# Patient Record
Sex: Male | Born: 1950 | Race: White | Hispanic: No | Marital: Married | Smoking: Current every day smoker
Health system: Southern US, Community
[De-identification: ages and names within clinical notes are randomized; demographics above are authoritative.]

## PROBLEM LIST (undated history)

## (undated) DIAGNOSIS — N529 Male erectile dysfunction, unspecified: Secondary | ICD-10-CM

## (undated) DIAGNOSIS — T7840XA Allergy, unspecified, initial encounter: Secondary | ICD-10-CM

## (undated) HISTORY — PX: VASECTOMY: SHX75

## (undated) HISTORY — DX: Allergy, unspecified, initial encounter: T78.40XA

## (undated) HISTORY — DX: Male erectile dysfunction, unspecified: N52.9

---

## 2003-02-15 ENCOUNTER — Ambulatory Visit (HOSPITAL_COMMUNITY): Admission: RE | Admit: 2003-02-15 | Discharge: 2003-02-15 | Payer: Self-pay | Admitting: Family Medicine

## 2003-02-15 ENCOUNTER — Encounter: Payer: Self-pay | Admitting: Family Medicine

## 2012-08-12 ENCOUNTER — Ambulatory Visit (INDEPENDENT_AMBULATORY_CARE_PROVIDER_SITE_OTHER): Payer: Managed Care, Other (non HMO) | Admitting: Family Medicine

## 2012-08-12 ENCOUNTER — Encounter: Payer: Self-pay | Admitting: Family Medicine

## 2012-08-12 VITALS — BP 130/78 | Temp 98.3°F | Wt 187.2 lb

## 2012-08-12 DIAGNOSIS — M545 Low back pain: Secondary | ICD-10-CM

## 2012-08-12 MED ORDER — HYDROCODONE-ACETAMINOPHEN 5-325 MG PO TABS: 1.0000 | ORAL_TABLET | ORAL | Status: DC | PRN

## 2012-08-12 MED ORDER — ETODOLAC ER 400 MG PO TB24: 400.0000 mg | ORAL_TABLET | Freq: Every day | ORAL | Status: DC

## 2012-08-12 MED ORDER — CHLORZOXAZONE 500 MG PO TABS: 500.0000 mg | ORAL_TABLET | Freq: Four times a day (QID) | ORAL | Status: DC | PRN

## 2012-08-12 NOTE — Patient Instructions (Addendum)
Gradual return to regular duties

## 2012-08-12 NOTE — Progress Notes (Signed)
  Subjective:    Patient ID: Brandon Vega, male    DOB: 07-30-1950, 62 y.o.   MRN: [id number]  Back Pain The current episode started yesterday. The problem occurs constantly. The problem has been gradually worsening since onset. The pain is present in the lumbar spine. The quality of the pain is described as burning and aching. The pain is at a severity of 7/10. The pain is severe. The pain is worse during the day. The symptoms are aggravated by bending and twisting. Stiffness is present in the morning. He has tried nothing for the symptoms. The treatment provided no relief.      Review of Systems  Musculoskeletal: Positive for back pain.   Otherwise negative.    Objective:   Physical Exam  Alert. HEENT normal. Vitals reviewed. Lungs clear. Heart regular rate and rhythm. Negative straight leg raise. Distal strength sensation intact. Positive left lower lumbar tenderness to deep palpation.      Assessment & Plan:  Impression lumbar strain and spasm. Not sciatica-discussed. Plan Lodine 400 by mouth twice a day with food. Symptomatic care discussed. Chlorzoxazone as needed. Symptomatic care discussed WSL

## 2013-02-18 ENCOUNTER — Encounter: Payer: Self-pay | Admitting: Family Medicine

## 2013-02-18 ENCOUNTER — Ambulatory Visit (INDEPENDENT_AMBULATORY_CARE_PROVIDER_SITE_OTHER): Payer: Managed Care, Other (non HMO) | Admitting: Family Medicine

## 2013-02-18 VITALS — BP 118/80 | Temp 98.9°F | Ht 69.0 in | Wt 183.4 lb

## 2013-02-18 DIAGNOSIS — J329 Chronic sinusitis, unspecified: Secondary | ICD-10-CM

## 2013-02-18 MED ORDER — LEVOFLOXACIN 500 MG PO TABS: 500.0000 mg | ORAL_TABLET | Freq: Every day | ORAL | Status: AC

## 2013-02-18 NOTE — Progress Notes (Signed)
  Subjective:    Patient ID: Brandon Vega, male    DOB: [birth year], 62 y.o.   MRN: [id number]  Sinusitis This is a new problem. The current episode started yesterday. Associated symptoms include congestion, coughing, headaches, sinus pressure, sneezing and a sore throat. Past treatments include lying down and oral decongestants. The treatment provided mild relief.    First struck yest, works nights, real bad sore throat.  Went on to work,  Frontal pressure, no measured fever  otc med nyquil, left work early,  Plus vicks sore throat spray    Review of Systems  HENT: Positive for congestion, sinus pressure, sneezing and sore throat.   Respiratory: Positive for cough.   Neurological: Positive for headaches.       Objective:   Physical Exam  Alert no acute distress. Moderate nasal congestion. Frontal tenderness. Pharynx erythematous neck supple. Lungs clear. Heart rare in rhythm.      Assessment & Plan:  Impression 1 rhinosinusitis plan antibiotics as prescribed. Symptomatic care discussed. WSL

## 2013-06-08 ENCOUNTER — Encounter: Payer: Self-pay | Admitting: Family Medicine

## 2013-06-08 ENCOUNTER — Ambulatory Visit (INDEPENDENT_AMBULATORY_CARE_PROVIDER_SITE_OTHER): Payer: 59 | Admitting: Family Medicine

## 2013-06-08 VITALS — BP 132/80 | Ht 69.0 in | Wt 187.6 lb

## 2013-06-08 DIAGNOSIS — N529 Male erectile dysfunction, unspecified: Secondary | ICD-10-CM

## 2013-06-08 MED ORDER — SILDENAFIL CITRATE 50 MG PO TABS: 50.0000 mg | ORAL_TABLET | ORAL | Status: DC | PRN

## 2013-06-08 NOTE — Progress Notes (Signed)
   Subjective:    Patient ID: Brandon Vega, male    DOB: 08-Jan-1951, 63 y.o.   MRN: [id number]  HPI Patient is here today d/t a thumb injury on his right hand. He can move the thumb.  He injured it at work last Thursday.  He took Advil one time on Thursday. No other treatment.   Thumb sprung back and hurting,  Progressive difficulty with erections, Patient request refills on Viagra. Took some samples in the past. Definitely helps him. Use the 50 mg dose. No obvious side effects.  Review of Systems No chest pain no headache no back pain no abdominal pain ROS otherwise negative    Objective:   Physical Exam  Alert lungs clear. Heart regular in rhythm. Hands grip intact sensation pulses good slight tenderness at base of thumb. Good range of motion      Assessment & Plan:  Impression probable thumb strain #2 rectal dysfunction discussed plan Viagra 50 mg refilled. Symptomatic care discussed. Encouraged to do yearly preventive physicals. WSL

## 2013-06-11 DIAGNOSIS — N529 Male erectile dysfunction, unspecified: Secondary | ICD-10-CM | POA: Insufficient documentation

## 2013-06-18 ENCOUNTER — Telehealth: Payer: Self-pay | Admitting: Family Medicine

## 2013-06-18 MED ORDER — SILDENAFIL CITRATE 50 MG PO TABS: 50.0000 mg | ORAL_TABLET | ORAL | Status: DC | PRN

## 2013-06-18 NOTE — Telephone Encounter (Signed)
Error

## 2013-06-18 NOTE — Telephone Encounter (Signed)
Rx prior auth obtained for VIAGRA 50mg  however pt's plan only allows #15/30days, Rx was originally for #20/30days, if "OK", please send new order to [facility] with corrected quantity of 15, if not, I will have to do a quantity limit override through [employer]  Please notify pt when done. (already faxed approval to pharmacy)

## 2013-06-18 NOTE — Telephone Encounter (Signed)
Medication sent to pharmacy. Left message on voicemail notifying patient.  

## 2013-06-18 NOTE — Telephone Encounter (Signed)
Ok, let's do

## 2013-10-19 ENCOUNTER — Encounter: Payer: Self-pay | Admitting: Family Medicine

## 2013-10-19 ENCOUNTER — Ambulatory Visit (HOSPITAL_COMMUNITY)
Admission: RE | Admit: 2013-10-19 | Discharge: 2013-10-19 | Disposition: A | Discharge status: Home / Self Care | Payer: 59 | Source: Ambulatory Visit | Attending: Family Medicine | Admitting: Family Medicine

## 2013-10-19 ENCOUNTER — Ambulatory Visit (INDEPENDENT_AMBULATORY_CARE_PROVIDER_SITE_OTHER): Payer: 59 | Admitting: Family Medicine

## 2013-10-19 VITALS — BP 104/78 | Ht 69.0 in | Wt 183.4 lb

## 2013-10-19 DIAGNOSIS — R079 Chest pain, unspecified: Secondary | ICD-10-CM | POA: Insufficient documentation

## 2013-10-19 DIAGNOSIS — M546 Pain in thoracic spine: Secondary | ICD-10-CM

## 2013-10-19 DIAGNOSIS — M7918 Myalgia, other site: Secondary | ICD-10-CM

## 2013-10-19 DIAGNOSIS — IMO0001 Reserved for inherently not codable concepts without codable children: Secondary | ICD-10-CM

## 2013-10-19 DIAGNOSIS — M4120 Other idiopathic scoliosis, site unspecified: Secondary | ICD-10-CM | POA: Insufficient documentation

## 2013-10-19 DIAGNOSIS — M47817 Spondylosis without myelopathy or radiculopathy, lumbosacral region: Secondary | ICD-10-CM | POA: Insufficient documentation

## 2013-10-19 MED ORDER — TRAMADOL HCL 50 MG PO TABS: 50.0000 mg | ORAL_TABLET | Freq: Three times a day (TID) | ORAL | Status: DC | PRN

## 2013-10-19 MED ORDER — NAPROXEN 500 MG PO TABS: 500.0000 mg | ORAL_TABLET | Freq: Two times a day (BID) | ORAL | Status: DC

## 2013-10-19 NOTE — Progress Notes (Signed)
   Subjective:    Patient ID: Brandon Vega, male    DOB: 05/30/1950, 63 y.o.   MRN: [id number]  Back Pain This is a new problem. The current episode started 1 to 4 weeks ago. The problem occurs constantly. The problem has been gradually worsening since onset. The pain is present in the thoracic spine. The quality of the pain is described as aching. The pain does not radiate. The pain is at a severity of 5/10. The pain is moderate. The pain is the same all the time. Stiffness is present all day. Pertinent negatives include no abdominal pain, chest pain or fever. Treatments tried: icyhot. The treatment provided no relief.   Patient states his index finger on his right hand gets numb at times.  Taking some advil without help. Tried icey-hot without help Wakes him up at night No hx of this Hx low back pain years ago No surgery  Review of Systems  Constitutional: Negative for fever and fatigue.  HENT: Negative for congestion.   Respiratory: Negative for cough and shortness of breath.   Cardiovascular: Negative for chest pain.  Gastrointestinal: Negative for abdominal pain.  Musculoskeletal: Positive for back pain.       Objective:   Physical Exam  Constitutional: He appears well-developed.  Neck: Normal range of motion.  Cardiovascular: Normal rate, regular rhythm and normal heart sounds.   Pulmonary/Chest: Effort normal.  Abdominal: Soft.   Moderate back pain in the rhomboid area and no tenderness in the spine       Assessment & Plan:  We will do x-ray of thoracic spine in the chest patient is worried about possibility cancer states he had had cancer of lung his age. Patient states he does smoke a pipe he knows he ought to quit. I also counseled him about coming in for a wellness exam.  More than likely is a pinched nerve in the thoracic spine causing the rhomboid pain x-rays pending.  Followup Dr. Richardson Landry 2 weeks

## 2013-11-09 ENCOUNTER — Ambulatory Visit: Payer: 59 | Admitting: Family Medicine

## 2014-03-08 ENCOUNTER — Encounter: Payer: Self-pay | Admitting: Family Medicine

## 2014-03-08 ENCOUNTER — Ambulatory Visit (INDEPENDENT_AMBULATORY_CARE_PROVIDER_SITE_OTHER): Payer: 59 | Admitting: Family Medicine

## 2014-03-08 VITALS — BP 128/78 | Ht 69.0 in | Wt 184.0 lb

## 2014-03-08 DIAGNOSIS — G4700 Insomnia, unspecified: Secondary | ICD-10-CM

## 2014-03-08 NOTE — Progress Notes (Signed)
   Subjective:    Patient ID: Brandon Vega, male    DOB: Jan 18, 1951, 63 y.o.   MRN: [id number]  HPI Patient is here today to see if you would be able to write him a work excuse for 15 days.  He retires on Nov 27 and has 15 sick days left. He will lose those days if he doesn't take them.  He has been under a great deal of stress. His daughter-in-law left his son, and now he is helping with watching the children, which is causing stress in the family.   Trouble sleeping,patient states very tired all the time. Under a lot of stress. Concerned that he is not safe at work with his general fatigue.  Feels if he just gets time off to be able to catch up on all of his family responsibilities. And start getting more sleep.  Claims no significant depression.  Works [employer] office in [place] No headache no chest pain no back pain ROS otherwise    Objective:   Physical Exam Alert no apparent distress HEENT normal. Lungs clear heart regular rhythm.       Assessment & Plan:  Impression insomnia with fatigue plan symptomatic care discussed. Over-the-counter medicine discussed. Exercise encouraged. Work excuse written W SL

## 2014-09-10 ENCOUNTER — Ambulatory Visit (INDEPENDENT_AMBULATORY_CARE_PROVIDER_SITE_OTHER): Payer: 59 | Admitting: Family Medicine

## 2014-09-10 ENCOUNTER — Encounter: Payer: Self-pay | Admitting: Family Medicine

## 2014-09-10 VITALS — BP 128/82 | Ht 69.0 in | Wt 187.4 lb

## 2014-09-10 DIAGNOSIS — E785 Hyperlipidemia, unspecified: Secondary | ICD-10-CM | POA: Diagnosis not present

## 2014-09-10 DIAGNOSIS — Z79899 Other long term (current) drug therapy: Secondary | ICD-10-CM | POA: Diagnosis not present

## 2014-09-10 DIAGNOSIS — Z125 Encounter for screening for malignant neoplasm of prostate: Secondary | ICD-10-CM

## 2014-09-10 NOTE — Progress Notes (Signed)
   Subjective:    Patient ID: Brandon Vega, male    DOB: 1951/05/05, 64 y.o.   MRN: [id number]  HPI  The patient comes in today for a wellness visit.    A review of their health history was completed.   A review of medications was also completed.  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   Additional concerns: ears stopped up   fam hx of diabete  Hx of elevated chol dozen yrs ago  Hx of elev chol    Review of Systems  Constitutional: Negative for fever, activity change and appetite change.  HENT: Negative for congestion and rhinorrhea.   Eyes: Negative for discharge.  Respiratory: Negative for cough and wheezing.   Cardiovascular: Negative for chest pain.  Gastrointestinal: Negative for vomiting, abdominal pain and blood in stool.  Genitourinary: Negative for frequency and difficulty urinating.  Musculoskeletal: Negative for neck pain.  Skin: Negative for rash.  Allergic/Immunologic: Negative for environmental allergies and food allergies.  Neurological: Negative for weakness and headaches.  Psychiatric/Behavioral: Negative for agitation.  All other systems reviewed and are negative.      Objective:   Physical Exam  Constitutional: He appears well-developed and well-nourished.  HENT:  Head: Normocephalic and atraumatic.  Right Ear: External ear normal.  Left Ear: External ear normal.  Nose: Nose normal.  Mouth/Throat: Oropharynx is clear and moist.  Eyes: EOM are normal. Pupils are equal, round, and reactive to light.  Neck: Normal range of motion. Neck supple. No thyromegaly present.  Cardiovascular: Normal rate, regular rhythm and normal heart sounds.   No murmur heard. Pulmonary/Chest: Effort normal and  breath sounds normal. No respiratory distress. He has no wheezes.  Abdominal: Soft. Bowel sounds are normal. He exhibits no distension and no mass. There is no tenderness.  Genitourinary: Penis normal.  Musculoskeletal: Normal range of motion. He exhibits no edema.  Lymphadenopathy:    He has no cervical adenopathy.  Neurological: He is alert. He exhibits normal muscle tone.  Skin: Skin is warm and dry. No erythema.  Psychiatric: He has a normal mood and affect. His behavior is normal. Judgment normal.  Vitals reviewed.  prostate within normal limits. Left external canal wax present        Assessment & Plan:  Impression wellness exam plan anticipatory guidance given. Diet discussed. Appropriate blood work. Colonoscopy sheet given and encouraged. Pneumonia vaccine discussed WSL

## 2014-09-11 LAB — LIPID PANEL
CHOL/HDL RATIO: 3.8 ratio (ref 0.0–5.0)
CHOLESTEROL TOTAL: 195 mg/dL (ref 100–199)
HDL: 51 mg/dL (ref >39)
LDL Calculated: 121 mg/dL — ABNORMAL HIGH (ref 0–99)
TRIGLYCERIDES: 117 mg/dL (ref 0–149)
VLDL Cholesterol Cal: 23 mg/dL (ref 5–40)

## 2014-09-11 LAB — BASIC METABOLIC PANEL
BUN / CREAT RATIO: 16 (ref 10–22)
BUN: 15 mg/dL (ref 8–27)
CO2: 26 mmol/L (ref 18–29)
Calcium: 9.9 mg/dL (ref 8.6–10.2)
Chloride: 101 mmol/L (ref 97–108)
Creatinine, Ser: 0.96 mg/dL (ref 0.76–1.27)
GFR, EST AFRICAN AMERICAN: 95 mL/min/{1.73_m2} (ref >59)
GFR, EST NON AFRICAN AMERICAN: 82 mL/min/{1.73_m2} (ref >59)
Glucose: 77 mg/dL (ref 65–99)
Potassium: 4.7 mmol/L (ref 3.5–5.2)
Sodium: 141 mmol/L (ref 134–144)

## 2014-09-11 LAB — PSA: Prostate Specific Ag, Serum: 0.3 ng/mL (ref 0.0–4.0)

## 2014-09-11 LAB — HEPATIC FUNCTION PANEL
ALT: 22 IU/L (ref 0–44)
AST: 25 IU/L (ref 0–40)
Albumin: 4.5 g/dL (ref 3.6–4.8)
Alkaline Phosphatase: 60 IU/L (ref 39–117)
BILIRUBIN TOTAL: 1.5 mg/dL — AB (ref 0.0–1.2)
Bilirubin, Direct: 0.29 mg/dL (ref 0.00–0.40)
Total Protein: 6.8 g/dL (ref 6.0–8.5)

## 2014-09-12 ENCOUNTER — Encounter: Payer: Self-pay | Admitting: Family Medicine

## 2014-12-15 ENCOUNTER — Ambulatory Visit (INDEPENDENT_AMBULATORY_CARE_PROVIDER_SITE_OTHER): Payer: 59 | Admitting: Family Medicine

## 2014-12-15 ENCOUNTER — Encounter: Payer: Self-pay | Admitting: Family Medicine

## 2014-12-15 VITALS — BP 126/84 | Temp 99.0°F | Ht 69.0 in | Wt 183.0 lb

## 2014-12-15 DIAGNOSIS — J329 Chronic sinusitis, unspecified: Secondary | ICD-10-CM

## 2014-12-15 DIAGNOSIS — J310 Chronic rhinitis: Secondary | ICD-10-CM

## 2014-12-15 MED ORDER — AZITHROMYCIN 250 MG PO TABS: ORAL_TABLET | ORAL | Status: DC

## 2014-12-15 NOTE — Progress Notes (Signed)
   Subjective:    Patient ID: Brandon Vega, male    DOB: 1950-08-02, 64 y.o.   MRN: [id number]  Cough This is a new problem. The current episode started in the past 7 days (started while in [place]). Associated symptoms include nasal congestion and a sore throat. Treatments tried: OTC cold medicine, advil cold and sinus and decongestant. The treatment provided mild relief.    cold sneezy and runny nose  Some loose stools    No major sickness on the team   ++ Review of Systems  HENT: Positive for sore throat.   Respiratory: Positive for cough.        Objective:   Physical Exam Alert vitals stable. HEENT moderate nasal congestion TMs retracted pharynx slight erythema neck supple. Lungs clear. Heart regular in rhythm.       Assessment & Plan:  No vomiting no diarrhea impression 1 post viral Rhinosyn he sinusitis element of otitis plan antibiotics prescribed. Symptomatic care discussed. WSL

## 2016-03-12 ENCOUNTER — Telehealth: Payer: Self-pay | Admitting: Family Medicine

## 2016-03-12 ENCOUNTER — Other Ambulatory Visit: Payer: Self-pay | Admitting: *Deleted

## 2016-03-12 MED ORDER — CIPROFLOXACIN HCL 500 MG PO TABS: 500.0000 mg | ORAL_TABLET | Freq: Two times a day (BID) | ORAL | 0 refills | Status: DC

## 2016-03-12 NOTE — Telephone Encounter (Signed)
Med sent to pharm. Pt notified on voicemail.  

## 2016-03-12 NOTE — Telephone Encounter (Signed)
Not a bad iedea to take cipro 500 one bid for five d if gets travelers diarrhea

## 2016-03-12 NOTE — Progress Notes (Unsigned)
cipro

## 2016-03-12 NOTE — Telephone Encounter (Signed)
Pt is leaving for [place] this Sat. Pt was told by the team leader that he would need to take antibiotics with him just incase due to the conditions there. Please advise.     [place]

## 2016-09-17 ENCOUNTER — Encounter: Payer: Self-pay | Admitting: Family Medicine

## 2016-09-17 ENCOUNTER — Ambulatory Visit (INDEPENDENT_AMBULATORY_CARE_PROVIDER_SITE_OTHER): Payer: 59 | Admitting: Family Medicine

## 2016-09-17 VITALS — BP 118/84 | Temp 99.0°F | Ht 69.0 in | Wt 190.0 lb

## 2016-09-17 DIAGNOSIS — H9319 Tinnitus, unspecified ear: Secondary | ICD-10-CM

## 2016-09-17 NOTE — Progress Notes (Signed)
   Subjective:    Patient ID: Brandon Vega, male    DOB: [birth year], 66 y.o.   MRN: [id number]  HPIringing and trouble hearing in both ears for over one year. Getting worse.  Failed hearing screen.   Gotten worse touble hearing the t v, having to turn up the vlume, difficulty hearing speeh  Developed gum infxn after a cleaning  Back on abx, still taing   Trouble hwRING AND TROUBLE WITH RINGING IN 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 Taking antibiotics from dentist.    Review of Systems No headache, no major weight loss or weight gain, no chest pain no back pain abdominal pain no change in bowel habits complete ROS otherwise negative     Objective:   Physical Exam Alert vitals stable, NAD. Blood pressure good on repeat. HEENT normal. Lungs clear. Heart regular rate and rhythm. 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 ENT referral recommended discussed we'll press on

## 2016-09-25 ENCOUNTER — Encounter: Payer: Self-pay | Admitting: Family Medicine

## 2016-10-29 ENCOUNTER — Ambulatory Visit (INDEPENDENT_AMBULATORY_CARE_PROVIDER_SITE_OTHER): Payer: 59 | Admitting: Otolaryngology

## 2016-10-29 DIAGNOSIS — H6122 Impacted cerumen, left ear: Secondary | ICD-10-CM

## 2016-10-29 DIAGNOSIS — H903 Sensorineural hearing loss, bilateral: Secondary | ICD-10-CM | POA: Diagnosis not present

## 2016-10-29 DIAGNOSIS — H9313 Tinnitus, bilateral: Secondary | ICD-10-CM | POA: Diagnosis not present

## 2017-04-29 ENCOUNTER — Ambulatory Visit (INDEPENDENT_AMBULATORY_CARE_PROVIDER_SITE_OTHER): Payer: 59 | Admitting: Otolaryngology

## 2017-04-29 DIAGNOSIS — H9313 Tinnitus, bilateral: Secondary | ICD-10-CM

## 2017-04-29 DIAGNOSIS — H903 Sensorineural hearing loss, bilateral: Secondary | ICD-10-CM | POA: Diagnosis not present

## 2017-06-04 ENCOUNTER — Ambulatory Visit: Payer: 59 | Admitting: Family Medicine

## 2017-06-04 ENCOUNTER — Encounter: Payer: Self-pay | Admitting: Family Medicine

## 2017-06-04 VITALS — BP 122/82 | Temp 98.2°F | Ht 69.0 in | Wt 192.2 lb

## 2017-06-04 DIAGNOSIS — B349 Viral infection, unspecified: Secondary | ICD-10-CM | POA: Diagnosis not present

## 2017-06-04 DIAGNOSIS — J019 Acute sinusitis, unspecified: Secondary | ICD-10-CM

## 2017-06-04 MED ORDER — AZITHROMYCIN 250 MG PO TABS: ORAL_TABLET | ORAL | 0 refills | Status: DC

## 2017-06-04 NOTE — Progress Notes (Addendum)
   Subjective:    Patient ID: Brandon Vega, male    DOB: [birth year], 67 y.o.   MRN: [id number]  Cough  This is a new problem. The current episode started in the past 7 days. Associated symptoms include chills, headaches, nasal congestion, rhinorrhea and a sore throat. Pertinent negatives include no chest pain, ear pain, fever or wheezing. He has tried OTC cough suppressant for the symptoms.  Patient was started off with a little bit of body aches headache some chills denies high fever chills sweats otherwise denies shortness of breath wheezing vomiting does relate sinus congestion    Review of Systems  Constitutional: Positive for chills. Negative for activity change and fever.  HENT: Positive for congestion, rhinorrhea and sore throat. Negative for ear pain.   Eyes: Negative for discharge.  Respiratory: Positive for cough. Negative for wheezing.   Cardiovascular: Negative for chest pain.  Gastrointestinal: Negative for nausea and vomiting.  Musculoskeletal: Negative for arthralgias.  Neurological: Positive for headaches.       Objective:   Physical Exam  Constitutional: He appears well-developed.  HENT:  Head: Normocephalic and atraumatic.  Mouth/Throat: Oropharynx is clear and moist. No oropharyngeal exudate.  Eyes: Right eye exhibits no discharge. Left eye exhibits no discharge.  Neck: Normal range of motion.  Cardiovascular: Normal rate, regular rhythm and normal heart sounds.  No murmur heard. Pulmonary/Chest: Effort normal and breath sounds normal. No respiratory distress. He has no wheezes. He has no rales.  Lymphadenopathy:    He has no cervical adenopathy.  Neurological: He exhibits normal muscle tone.  Skin: Skin is warm and dry.  Nursing note and vitals reviewed.     I did tell the patient if he gets worse he needs a follow-up    Assessment & Plan:  Viral syndrome possibly flulike illness no signs of any serious complication there is no need for any type of  lab work x-rays etc.  Patient was instructed to lay low over the next few days patient instructed to avoid any type of regular exercise for now  Patient does feel he is developing a mild sinus infection he requests an antibiotic which is reasonable antibiotic was sent and warning signs were discussed

## 2017-08-06 ENCOUNTER — Encounter: Payer: Self-pay | Admitting: Family Medicine

## 2017-08-06 ENCOUNTER — Ambulatory Visit (INDEPENDENT_AMBULATORY_CARE_PROVIDER_SITE_OTHER): Payer: 59 | Admitting: Family Medicine

## 2017-08-06 VITALS — BP 148/96 | Ht 68.5 in | Wt 190.0 lb

## 2017-08-06 DIAGNOSIS — Z125 Encounter for screening for malignant neoplasm of prostate: Secondary | ICD-10-CM | POA: Diagnosis not present

## 2017-08-06 DIAGNOSIS — Z0000 Encounter for general adult medical examination without abnormal findings: Secondary | ICD-10-CM

## 2017-08-06 DIAGNOSIS — Z1329 Encounter for screening for other suspected endocrine disorder: Secondary | ICD-10-CM

## 2017-08-06 DIAGNOSIS — R030 Elevated blood-pressure reading, without diagnosis of hypertension: Secondary | ICD-10-CM | POA: Diagnosis not present

## 2017-08-06 NOTE — Patient Instructions (Signed)

## 2017-08-06 NOTE — Progress Notes (Signed)
   Subjective:    Patient ID: Brandon Vega, male    DOB: Jul 26, 1950, 67 y.o.   MRN: [id number]  HPI AWV- Annual Wellness Visit  The patient was seen for their annual wellness visit. The patient's past medical history, surgical history, and family history were reviewed. Pertinent vaccines were reviewed ( tetanus, pneumonia, shingles, flu) The patient's medication list was reviewed and updated.  The height and weight were entered.  BMI recorded in electronic record elsewhere  Cognitive screening was completed. Outcome of Mini - Cog: pass   Falls /depression screening electronically recorded within record elsewhere  Current tobacco usage: every day smoker (All patients who use tobacco were given written and verbal information on quitting)  Recent listing of emergency department/hospitalizations over the past year were reviewed.  current specialist the patient sees on a regular basis: none   Medicare annual wellness visit patient questionnaire was reviewed.  A written screening schedule for the patient for the next 5-10 years was given. Appropriate discussion of followup regarding next visit was discussed.   Pt had right flank pain, co wondered if due to new genix supplement which pt take as energy booster    Several yrs since blood work   Good exercise regimen reg  Never had colonoscopy  Smokes pipe reg ,    Married 81 yrs    Review of Systems  Constitutional: Negative for activity change, appetite change and fever.  HENT: Negative for congestion and rhinorrhea.   Eyes: Negative for discharge.  Respiratory: Negative for cough and wheezing.   Cardiovascular: Negative for chest pain.  Gastrointestinal: Negative for abdominal pain, blood in stool and vomiting.  Genitourinary: Negative for difficulty urinating and frequency.  Musculoskeletal: Negative for neck pain.  Skin: Negative for rash.  Allergic/Immunologic: Negative for environmental allergies and food  allergies.  Neurological: Negative for weakness and headaches.  Psychiatric/Behavioral: Negative for agitation.  All other systems reviewed and are negative.      Objective:   Physical Exam  Constitutional: He appears well-developed and well-nourished.  HENT:  Head: Normocephalic and atraumatic.  Right Ear: External ear normal.  Left Ear: External ear normal.  Nose: Nose normal.  Mouth/Throat: Oropharynx is clear and moist.  Eyes: Right eye exhibits no discharge. Left eye exhibits no discharge. No scleral icterus.  Neck: Normal range of motion. Neck supple. No thyromegaly present.  Cardiovascular: Normal rate, regular rhythm and normal heart sounds.  No murmur heard. Pulmonary/Chest: Effort normal and breath sounds normal. No respiratory distress. He has no wheezes.  Abdominal: Soft. Bowel sounds are normal. He exhibits no distension and no mass. There is no tenderness.  Genitourinary: Penis normal.  Genitourinary Comments: Prostate exam within normal limits  Musculoskeletal: Normal range of motion. He exhibits no edema.  Lymphadenopathy:    He has no cervical adenopathy.  Neurological: He is alert. He exhibits normal muscle tone. Coordination normal.  Skin: Skin is warm and dry. No erythema.  Psychiatric: He has a normal mood and affect. His behavior is normal. Judgment normal.  Vitals reviewed.         Assessment & Plan:  Impression wellness exam patient finally agrees to colonoscopy we will help set up appropriate blood work.  Diet exercise discussed.  Vaccines clarified  2.  Elevated blood pressure.  Concerning to the patient.  Seen this multiple times at home will do some blood work.  Educational information given.  Recheck in several weeks if numbers still elevated will initiate meds

## 2017-08-07 ENCOUNTER — Encounter (INDEPENDENT_AMBULATORY_CARE_PROVIDER_SITE_OTHER): Payer: Self-pay | Admitting: *Deleted

## 2017-08-07 ENCOUNTER — Encounter: Payer: Self-pay | Admitting: Family Medicine

## 2017-08-08 LAB — CBC WITH DIFFERENTIAL/PLATELET
BASOS: 0 %
Basophils Absolute: 0.1 10*3/uL (ref 0.0–0.2)
EOS (ABSOLUTE): 0.3 10*3/uL (ref 0.0–0.4)
EOS: 2 %
HEMATOCRIT: 45.3 % (ref 37.5–51.0)
HEMOGLOBIN: 14.8 g/dL (ref 13.0–17.7)
IMMATURE GRANS (ABS): 0 10*3/uL (ref 0.0–0.1)
Immature Granulocytes: 0 %
Lymphocytes Absolute: 5.6 10*3/uL — ABNORMAL HIGH (ref 0.7–3.1)
Lymphs: 49 %
MCH: 31.4 pg (ref 26.6–33.0)
MCHC: 32.7 g/dL (ref 31.5–35.7)
MCV: 96 fL (ref 79–97)
Monocytes Absolute: 0.7 10*3/uL (ref 0.1–0.9)
Monocytes: 6 %
NEUTROS ABS: 5.1 10*3/uL (ref 1.4–7.0)
Neutrophils: 43 %
Platelets: 243 10*3/uL (ref 150–379)
RBC: 4.71 x10E6/uL (ref 4.14–5.80)
RDW: 13.2 % (ref 12.3–15.4)
WBC: 11.7 10*3/uL — ABNORMAL HIGH (ref 3.4–10.8)

## 2017-08-08 LAB — LIPID PANEL
CHOL/HDL RATIO: 4.1 ratio (ref 0.0–5.0)
CHOLESTEROL TOTAL: 189 mg/dL (ref 100–199)
HDL: 46 mg/dL (ref >39)
LDL Calculated: 126 mg/dL — ABNORMAL HIGH (ref 0–99)
TRIGLYCERIDES: 87 mg/dL (ref 0–149)
VLDL Cholesterol Cal: 17 mg/dL (ref 5–40)

## 2017-08-08 LAB — TSH: TSH: 2.61 u[IU]/mL (ref 0.450–4.500)

## 2017-08-08 LAB — BASIC METABOLIC PANEL
BUN / CREAT RATIO: 12 (ref 10–24)
BUN: 12 mg/dL (ref 8–27)
CO2: 22 mmol/L (ref 20–29)
Calcium: 9.4 mg/dL (ref 8.6–10.2)
Chloride: 104 mmol/L (ref 96–106)
Creatinine, Ser: 1 mg/dL (ref 0.76–1.27)
GFR calc Af Amer: 88 mL/min/{1.73_m2} (ref >59)
GFR calc non Af Amer: 76 mL/min/{1.73_m2} (ref >59)
GLUCOSE: 91 mg/dL (ref 65–99)
POTASSIUM: 4.9 mmol/L (ref 3.5–5.2)
SODIUM: 142 mmol/L (ref 134–144)

## 2017-08-08 LAB — PSA: Prostate Specific Ag, Serum: 0.3 ng/mL (ref 0.0–4.0)

## 2017-08-08 LAB — HEPATIC FUNCTION PANEL
ALBUMIN: 4.2 g/dL (ref 3.6–4.8)
ALT: 23 IU/L (ref 0–44)
AST: 28 IU/L (ref 0–40)
Alkaline Phosphatase: 59 IU/L (ref 39–117)
Bilirubin Total: 1.5 mg/dL — ABNORMAL HIGH (ref 0.0–1.2)
Bilirubin, Direct: 0.33 mg/dL (ref 0.00–0.40)
TOTAL PROTEIN: 6.6 g/dL (ref 6.0–8.5)

## 2017-08-26 ENCOUNTER — Ambulatory Visit: Payer: 59 | Admitting: Family Medicine

## 2017-08-26 ENCOUNTER — Encounter: Payer: Self-pay | Admitting: Family Medicine

## 2017-08-26 VITALS — BP 150/90 | Ht 68.5 in | Wt 191.0 lb

## 2017-08-26 DIAGNOSIS — M25552 Pain in left hip: Secondary | ICD-10-CM

## 2017-08-26 DIAGNOSIS — I10 Essential (primary) hypertension: Secondary | ICD-10-CM | POA: Insufficient documentation

## 2017-08-26 DIAGNOSIS — Z79899 Other long term (current) drug therapy: Secondary | ICD-10-CM

## 2017-08-26 MED ORDER — ENALAPRIL MALEATE 5 MG PO TABS: ORAL_TABLET | ORAL | 5 refills | Status: DC

## 2017-08-26 MED ORDER — DICLOFENAC SODIUM 75 MG PO TBEC: DELAYED_RELEASE_TABLET | ORAL | 0 refills | Status: DC

## 2017-08-26 NOTE — Progress Notes (Signed)
   Subjective:    Patient ID: Jose Persia, male    DOB: [birth year], 67 y.o.   MRN: [id number] Patient arrives with multiple concerns HPI Patient is here today to follow up high bp.He states he has been checking bp at [facility] and they are 128/82,138/86,105/72,113/79,135/81.  No fam hx of htn     has left pain under left buttock when sitting and when he gets up. Left hip pain, for about two weeks, hurts when pt ogoes to get up. Pain in lat post hip. Notes a bit of trouble wlking, limps at first  Has con'td to exercise, running on the treafmill and doing the elliptical   Pt has ellip at the [employer] a couple of avil to see if pain would go away          Review of Systems No headache, no major weight loss or weight gain, no chest pain no back pain abdominal pain no change in bowel habits complete ROS otherwise negative     Objective:   Physical Exam  Alert and oriented, vitals reviewed and stable, NAD ENT-TM's and ext canals WNL bilat via otoscopic exam Soft palate, tonsils and post pharynx WNL via oropharyngeal exam Neck-symmetric, no masses; thyroid nonpalpable and nontender Pulmonary-no tachypnea or accessory muscle use; Clear without wheezes via auscultation Card--no abnrml murmurs, rhythm reg and rate WNL Carotid pulses symmetric, without bruits Negative straight leg raise, negative sciatic notch tenderness, slight lateral trochanteric prominence tenderness.  Some pain with internal rotation    impression 1 hypertension.  Discussed at length long-term potential negatives discussed with medication discussed side effects benefits discussed      Assessment & Plan:    #2  Hip pain.  2 weeks duration.  To call chronic.  Trial of anti-inflammatory.  Back off of it on excess exertion.  Discussed  Greater than 50% of this 25 minute face to face visit was spent in counseling and discussion and coordination of care regarding the above diagnosis/diagnosies

## 2017-08-26 NOTE — Patient Instructions (Signed)
Omron 5 series, at [employer], forty bucks ,  +

## 2017-09-04 ENCOUNTER — Telehealth: Payer: Self-pay | Admitting: Family Medicine

## 2017-09-04 NOTE — Telephone Encounter (Signed)
Patient was seen on 4/15 with left hip pain stating it worst and wants something called into [facility]

## 2017-09-04 NOTE — Telephone Encounter (Signed)
ok 

## 2017-09-04 NOTE — Telephone Encounter (Signed)
Patient states he did not want anything called in.He says he may need to be seen again as it is no better and it may need xray. Pt scheduled for an appointment for 09/05/2017 here.

## 2017-09-05 ENCOUNTER — Ambulatory Visit: Payer: 59 | Admitting: Family Medicine

## 2017-09-05 ENCOUNTER — Encounter: Payer: Self-pay | Admitting: Family Medicine

## 2017-09-05 ENCOUNTER — Telehealth: Payer: Self-pay | Admitting: Family Medicine

## 2017-09-05 VITALS — BP 132/78 | Ht 68.5 in | Wt 193.0 lb

## 2017-09-05 DIAGNOSIS — M25552 Pain in left hip: Secondary | ICD-10-CM

## 2017-09-05 DIAGNOSIS — M25559 Pain in unspecified hip: Secondary | ICD-10-CM

## 2017-09-05 MED ORDER — DICLOFENAC SODIUM 75 MG PO TBEC: DELAYED_RELEASE_TABLET | ORAL | 0 refills | Status: DC

## 2017-09-05 NOTE — Telephone Encounter (Signed)
Patient is aware he should hear from the Dr Verneita Griffes soon.Referral placed in the epic.

## 2017-09-05 NOTE — Telephone Encounter (Signed)
Patient called with the name of the doctor he would like to be referred to for his hip.  He said he would like to be referred to Dr. Reynaldo Minium at [facility].

## 2017-09-05 NOTE — Telephone Encounter (Signed)
FYI

## 2017-09-05 NOTE — Telephone Encounter (Signed)
Lets do it

## 2017-09-05 NOTE — Progress Notes (Signed)
   Subjective:    Patient ID: Brandon Vega, male    DOB: 02/10/1951, 67 y.o.   MRN: [id number]  Hip Pain   The incident occurred more than 1 week ago. The pain is present in the left hip and left thigh. He has tried NSAIDs (Advil) for the symptoms. The treatment provided mild relief.    Pt states he was running Monday and he felt something pull. When he goes to stand up he feels a "pulled" sensation in his hip area.   Pt tried to run slowly  Pt felt something "go" in the back part of the hip  Had to limp back to the gym  Trouble walking ,  No prob lwith hip down thu the yrs  Know low back    Last week took it easy tried not to exert himsef  Review of Systems No headache, no major weight loss or weight gain, no chest pain no back pain abdominal pain no change in bowel habits complete ROS otherwise negative     Objective:   Physical Exam  Alert vitals stable, NAD. Blood pressure good on repeat. HEENT normal. Lungs clear. Heart regular rate and rhythm. Negative straight leg raise left side positive posterior hip tenderness.  Negative sciatic notch tenderness.  Negative lateral tenderness positive pain with internal touch      Assessment & Plan:  Impression posterior hip bursitis.  Patient likes to run.  Periactin.  We will do x-ray.  Orthopedic referral because of high activity level discussed

## 2017-09-06 ENCOUNTER — Other Ambulatory Visit (INDEPENDENT_AMBULATORY_CARE_PROVIDER_SITE_OTHER): Payer: Self-pay | Admitting: *Deleted

## 2017-09-06 ENCOUNTER — Ambulatory Visit (HOSPITAL_COMMUNITY)
Admission: RE | Admit: 2017-09-06 | Discharge: 2017-09-06 | Disposition: A | Discharge status: Home / Self Care | Payer: 59 | Source: Ambulatory Visit | Attending: Family Medicine | Admitting: Family Medicine

## 2017-09-06 DIAGNOSIS — M25552 Pain in left hip: Secondary | ICD-10-CM | POA: Diagnosis present

## 2017-09-06 DIAGNOSIS — M1612 Unilateral primary osteoarthritis, left hip: Secondary | ICD-10-CM | POA: Insufficient documentation

## 2017-09-06 DIAGNOSIS — Z1211 Encounter for screening for malignant neoplasm of colon: Secondary | ICD-10-CM

## 2017-09-17 ENCOUNTER — Telehealth: Payer: Self-pay | Admitting: Family Medicine

## 2017-09-17 ENCOUNTER — Encounter: Payer: Self-pay | Admitting: Family Medicine

## 2017-09-17 MED ORDER — DICLOFENAC SODIUM 75 MG PO TBEC: DELAYED_RELEASE_TABLET | ORAL | 1 refills | Status: DC

## 2017-09-17 NOTE — Telephone Encounter (Signed)
Prescription faxed to pharmacy. Patient notified. 

## 2017-09-17 NOTE — Telephone Encounter (Signed)
Patient said he only has a few days left on his Diclofenac Rx.  He is requesting a refill on this.  [employer] [address]

## 2017-09-17 NOTE — Telephone Encounter (Signed)
Seen 4/25 for hip pain

## 2017-09-17 NOTE — Telephone Encounter (Signed)
Ok plus one ref 

## 2017-11-04 ENCOUNTER — Telehealth: Payer: Self-pay | Admitting: Family Medicine

## 2017-11-04 MED ORDER — DICLOFENAC SODIUM 75 MG PO TBEC: DELAYED_RELEASE_TABLET | ORAL | 1 refills | Status: DC

## 2017-11-04 NOTE — Telephone Encounter (Signed)
Some take for yrs but I would prefer pt not to, give enough refills to last til next visit, if pt can self wewan in mean time, switching back to tylenol, would be best, if cant stay on, and f u aS SCHED

## 2017-11-04 NOTE — Telephone Encounter (Signed)
Diclofenac initated for the hip bursitis and not meant for the long term, having said that some folks take long term for chronic arthritis and can experience stomach ulcers and uncommonly kidney damage if they take for yrs, hopefull his ortho will fix th e bursitis so he can stop soon, it is not causing all his other achey joints, may want to mention those to his ortho

## 2017-11-04 NOTE — Telephone Encounter (Signed)
Patient has been taking diclofenac due to his hip.  He wants to know if there are any long-term side effects to taking this?  He is experiencing a lot of pain in shoulders, hip, pain and legs and wants to know if this could be coming from the Diclofenac.

## 2017-11-04 NOTE — Telephone Encounter (Signed)
Patient advised per Dr Richardson Landry- Diclofenac initated for the hip bursitis and not meant for the long term, having said that some folks take long term for chronic arthritis and can experience stomach ulcers and uncommonly kidney damage if they take for yrs, hopefull his ortho will fix th e bursitis so he can stop soon, it is not causing all his other achey joints, may want to mention those to his ortho. Patient verbalized understanding and stated he has already seen the ortho and he said it was arthritis and gave him exercise sheet and to follow up in 6 months. Patient said it feels better with the med but he was just not sure how long it was ok to take it this would be his 4th refill. Patient states he would like a refill if you feel like it is ok to keep taking it.

## 2017-11-04 NOTE — Telephone Encounter (Signed)
Patient advised per Dr Richardson Landry- Some take for yrs but he would prefer pt not to, give enough refills to last til next visit, if pt can self wean in mean time, switching back to tylenol, would be best, if cant stay on, and follow up as scheduled. Patient verbalized understanding. Prescription sent electronically to pharmacy.

## 2017-11-12 ENCOUNTER — Encounter (INDEPENDENT_AMBULATORY_CARE_PROVIDER_SITE_OTHER): Payer: Self-pay | Admitting: *Deleted

## 2017-11-12 ENCOUNTER — Telehealth (INDEPENDENT_AMBULATORY_CARE_PROVIDER_SITE_OTHER): Payer: Self-pay | Admitting: *Deleted

## 2017-11-12 MED ORDER — PEG 3350-KCL-NA BICARB-NACL 420 G PO SOLR: 4000.0000 mL | Freq: Once | ORAL | 0 refills | Status: AC

## 2017-11-12 NOTE — Telephone Encounter (Signed)
Patient needs trilyte 

## 2017-11-22 ENCOUNTER — Encounter: Payer: Self-pay | Admitting: Family Medicine

## 2017-11-22 ENCOUNTER — Ambulatory Visit: Payer: 59 | Admitting: Family Medicine

## 2017-11-22 VITALS — BP 142/88 | Ht 68.5 in | Wt 191.2 lb

## 2017-11-22 DIAGNOSIS — M545 Low back pain, unspecified: Secondary | ICD-10-CM

## 2017-11-22 DIAGNOSIS — M25552 Pain in left hip: Secondary | ICD-10-CM

## 2017-11-22 DIAGNOSIS — I10 Essential (primary) hypertension: Secondary | ICD-10-CM

## 2017-11-22 MED ORDER — DICLOFENAC SODIUM 75 MG PO TBEC: DELAYED_RELEASE_TABLET | ORAL | 2 refills | Status: DC

## 2017-11-22 MED ORDER — AMOXICILLIN 500 MG PO CAPS: ORAL_CAPSULE | ORAL | 0 refills | Status: DC

## 2017-11-22 MED ORDER — ENALAPRIL MALEATE 5 MG PO TABS: ORAL_TABLET | ORAL | 5 refills | Status: DC

## 2017-11-22 NOTE — Progress Notes (Signed)
   Subjective:    Patient ID: Brandon Vega, male    DOB: February 08, 1951, 67 y.o.   MRN: [id number]  Hypertension  This is a chronic problem. There are no compliance problems.    Blood pressure medicine and blood pressure levels reviewed today with patient. Compliant with blood pressure medicine. States does not miss a dose. No obvious side effects. Blood pressure generally good when checked elsewhere. Watching salt intake.   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 He went to orthopedic in [place]. 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 The diclofenac really helps       Review of Systems No headache, no major weight loss or weight gain, no chest pain no back pain abdominal pain no change in bowel habits complete ROS otherwise negative     Objective:   Physical Exam  Alert and oriented, vitals reviewed and stable, NAD ENT-TM's and ext canals WNL bilat via otoscopic exam Soft palate, tonsils and post pharynx WNL via oropharyngeal exam Neck-symmetric, no masses; thyroid nonpalpable and nontender Pulmonary-no tachypnea or accessory muscle use; Clear without wheezes via auscultation Card--no abnrml murmurs, rhythm reg and rate WNL Carotid pulses symmetric, without bruits  Negative straight leg raise positive para spinal lumbar tenderness left side negative sciatic notch     Assessment & Plan:  1 impression hypertension.  Generally good  control.  Numbers up a bit today.  Unable to take medication last night  2.  Lumbar strain.  Discussed.  Highly doubt sciatica.  Rationale discussed.  Local measures discussed.  Regular stretching strengthening exercises improved  3.  Long-term diclofenac use.  Discussed at length somewhat concerning to patient discussed potential for long-term renal damage.  Will use medicine more on a as needed basis.  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

## 2017-11-25 ENCOUNTER — Other Ambulatory Visit: Payer: Self-pay | Admitting: Family Medicine

## 2017-11-25 ENCOUNTER — Telehealth: Payer: Self-pay | Admitting: Family Medicine

## 2017-11-25 ENCOUNTER — Ambulatory Visit: Payer: 59 | Admitting: Family Medicine

## 2017-11-25 MED ORDER — TIZANIDINE HCL 4 MG PO CAPS: ORAL_CAPSULE | ORAL | 0 refills | Status: DC

## 2017-11-25 MED ORDER — PREDNISONE 20 MG PO TABS: ORAL_TABLET | ORAL | 0 refills | Status: DC

## 2017-11-25 NOTE — Telephone Encounter (Signed)
Was seen Friday.  Hip and back pain has gotten worse.  Said meds is not touching the pain.  Can we give him something stronger? [facility] [address]

## 2017-11-25 NOTE — Telephone Encounter (Signed)
Spoke with patient. Informed patient to stop Diclofenac. Sent meds into pharmacy. Pt verbalized understanding.

## 2017-11-25 NOTE — Telephone Encounter (Signed)
Stop diclofenac, adult pred taper, add zanaflex  4 mg one tid ten days worth, adult pred tpaer is the strongest anti inflammatory approach we have, if not substantially better by next wk rec o v

## 2017-11-25 NOTE — Telephone Encounter (Signed)
Patient given Diclofenac on Friday

## 2017-12-03 ENCOUNTER — Telehealth (INDEPENDENT_AMBULATORY_CARE_PROVIDER_SITE_OTHER): Payer: Self-pay | Admitting: *Deleted

## 2017-12-03 NOTE — Telephone Encounter (Signed)
Referring MD/PCP: steve luking   Procedure: tcs  Reason/Indication:  screening  Has patient had this procedure before?  no  If so, when, by whom and where?    Is there a family history of colon cancer?  no  Who?  What age when diagnosed?    Is patient diabetic?   no      Does patient have prosthetic heart valve or mechanical valve?  no  Do you have a pacemaker?  no  Has patient ever had endocarditis? no  Has patient had joint replacement within last 12 months?  no  Is patient constipated or do they take laxatives? no  Does patient have a history of alcohol/drug use?  no  Is patient on blood thinner such as Coumadin, Plavix and/or Aspirin? no  Medications: vitamins, nugenix  Allergies: nkda  Medication Adjustment per Dr Lindi Adie, NP:   Procedure date & time: 12/26/17 at 930

## 2017-12-04 NOTE — Telephone Encounter (Signed)
agree

## 2017-12-05 ENCOUNTER — Ambulatory Visit (INDEPENDENT_AMBULATORY_CARE_PROVIDER_SITE_OTHER): Payer: 59 | Admitting: Otolaryngology

## 2017-12-09 ENCOUNTER — Ambulatory Visit (INDEPENDENT_AMBULATORY_CARE_PROVIDER_SITE_OTHER): Payer: 59 | Admitting: Otolaryngology

## 2017-12-09 DIAGNOSIS — H903 Sensorineural hearing loss, bilateral: Secondary | ICD-10-CM | POA: Diagnosis not present

## 2017-12-09 DIAGNOSIS — H6122 Impacted cerumen, left ear: Secondary | ICD-10-CM | POA: Diagnosis not present

## 2017-12-26 ENCOUNTER — Other Ambulatory Visit: Payer: Self-pay

## 2017-12-26 ENCOUNTER — Ambulatory Visit (HOSPITAL_COMMUNITY)
Admission: RE | Admit: 2017-12-26 | Discharge: 2017-12-26 | Disposition: A | Discharge status: Home / Self Care | Payer: 59 | Source: Ambulatory Visit | Attending: Internal Medicine | Admitting: Internal Medicine

## 2017-12-26 ENCOUNTER — Encounter (HOSPITAL_COMMUNITY): Payer: Self-pay

## 2017-12-26 ENCOUNTER — Encounter (HOSPITAL_COMMUNITY)
Admission: RE | Disposition: A | Discharge status: Home / Self Care | Payer: Self-pay | Source: Ambulatory Visit | Attending: Internal Medicine

## 2017-12-26 DIAGNOSIS — K644 Residual hemorrhoidal skin tags: Secondary | ICD-10-CM | POA: Diagnosis not present

## 2017-12-26 DIAGNOSIS — I10 Essential (primary) hypertension: Secondary | ICD-10-CM | POA: Insufficient documentation

## 2017-12-26 DIAGNOSIS — Z1211 Encounter for screening for malignant neoplasm of colon: Secondary | ICD-10-CM | POA: Insufficient documentation

## 2017-12-26 DIAGNOSIS — K5730 Diverticulosis of large intestine without perforation or abscess without bleeding: Secondary | ICD-10-CM | POA: Insufficient documentation

## 2017-12-26 HISTORY — PX: COLONOSCOPY: SHX5424

## 2017-12-26 SURGERY — COLONOSCOPY
Anesthesia: Moderate Sedation

## 2017-12-26 MED ORDER — MIDAZOLAM HCL 5 MG/5ML IJ SOLN
INTRAMUSCULAR | Status: AC
  Filled 2017-12-26: qty 10

## 2017-12-26 MED ORDER — MIDAZOLAM HCL 5 MG/5ML IJ SOLN
INTRAMUSCULAR | Status: DC | PRN
  Administered 2017-12-26 (×2): 2 mg via INTRAVENOUS

## 2017-12-26 MED ORDER — MEPERIDINE HCL 50 MG/ML IJ SOLN
INTRAMUSCULAR | Status: DC | PRN
  Administered 2017-12-26 (×2): 25 mg via INTRAVENOUS

## 2017-12-26 MED ORDER — MEPERIDINE HCL 50 MG/ML IJ SOLN
INTRAMUSCULAR | Status: AC
  Filled 2017-12-26: qty 1

## 2017-12-26 MED ORDER — SODIUM CHLORIDE 0.9 % IV SOLN
INTRAVENOUS | Status: DC
  Administered 2017-12-26 (×2): via INTRAVENOUS

## 2017-12-26 MED ORDER — STERILE WATER FOR IRRIGATION IR SOLN
Status: DC | PRN
  Administered 2017-12-26: 09:00:00

## 2017-12-26 NOTE — Op Note (Signed)
[facility] Patient Name: Day Deery Procedure Date: 12/26/2017 9:00 AM MRN: [id number] Date of Birth: 11/16/1950 Attending MD: Hildred Laser , MD CSN: [id number] Age: 67 Admit Type: Outpatient Procedure:                Colonoscopy Indications:              Screening for colorectal malignant neoplasm Providers:                Hildred Laser, MD, Otis Peak B. Sharon Seller, RN, Nelma Rothman, Technician Referring MD:             Grace Bushy. Wolfgang Phoenix, MD Medicines:                Meperidine 50 mg IV, Midazolam 4 mg IV Complications:            No immediate complications. Estimated Blood Loss:     Estimated blood loss: none. Procedure:                Pre-Anesthesia Assessment:                           - Prior to the procedure, a History and Physical                            was performed, and patient medications and                            allergies were reviewed. The patient's tolerance of                            previous anesthesia was also reviewed. The risks                            and benefits of the procedure and the sedation                            options and risks were discussed with the patient.                            All questions were answered, and informed consent                            was obtained. Prior Anticoagulants: The patient has                            taken no previous anticoagulant or antiplatelet                            agents. ASA Grade Assessment: I - A normal, healthy                            patient. After reviewing the risks and benefits,  the patient was deemed in satisfactory condition to                            undergo the procedure.                           After obtaining informed consent, the colonoscope                            was passed under direct vision. Throughout the                            procedure, the patient's blood pressure, pulse, and          oxygen saturations were monitored continuously. The                            [id number]-[id number] ([id number]) scope was introduced through                            the anus and advanced to the the cecum, identified                            by appendiceal orifice and ileocecal valve. The                            colonoscopy was performed without difficulty. The                            patient tolerated the procedure well. The quality                            of the bowel preparation was good. The ileocecal                            valve, appendiceal orifice, and rectum were                            photographed. Scope In: 9:17:09 AM Scope Out: 9:39:52 AM Scope Withdrawal Time: 0 hours 15 minutes 31 seconds  Total Procedure Duration: 0 hours 22 minutes 43 seconds  Findings:      The perianal and digital rectal examinations were normal.      A few small-mouthed diverticula were found in the sigmoid colon.      External hemorrhoids were found during retroflexion. The hemorrhoids       were small. Impression:               - Diverticulosis in the sigmoid colon.                           - External hemorrhoids.                           - No specimens collected. Moderate Sedation:      Moderate (conscious) sedation was administered by the endoscopy nurse       and  supervised by the endoscopist. The following parameters were       monitored: oxygen saturation, heart rate, blood pressure, CO2       capnography and response to care. Total physician intraservice time was       29 minutes. Recommendation:           - Patient has a contact number available for                            emergencies. The signs and symptoms of potential                            delayed complications were discussed with the                            patient. Return to normal activities tomorrow.                            Written discharge instructions were provided to the                             patient.                           - High fiber diet today.                           - Continue present medications.                           - Repeat colonoscopy in 10 years for screening                            purposes. Procedure Code(s):        --- Professional ---                           [phone], Colonoscopy, flexible; diagnostic, including                            collection of specimen(s) by brushing or washing,                            when performed (separate procedure)                           G0500, Moderate sedation services provided by the                            same physician or other qualified health care                            professional performing a gastrointestinal                            endoscopic service that sedation supports,  requiring the presence of an independent trained                            observer to assist in the monitoring of the                            patient's level of consciousness and physiological                            status; initial 15 minutes of intra-service time;                            patient age 16 years or older (additional time may                            be reported with [phone], as appropriate)                           [id number], Moderate sedation services provided by the                            same physician or other qualified health care                            professional performing the diagnostic or                            therapeutic service that the sedation supports,                            requiring the presence of an independent trained                            observer to assist in the monitoring of the                            patient's level of consciousness and physiological                            status; each additional 15 minutes intraservice                            time (List separately in addition to code for                             primary service) Diagnosis Code(s):        --- Professional ---                           Z12.11, Encounter for screening for malignant                            neoplasm of colon  K64.4, Residual hemorrhoidal skin tags                           K57.30, Diverticulosis of large intestine without                            perforation or abscess without bleeding CPT copyright 2017 [employer]. All rights reserved. The codes documented in this report are preliminary and upon coder review may  be revised to meet current compliance requirements. Hildred Laser, MD Hildred Laser, MD 12/26/2017 9:46:12 AM This report has been signed electronically. Number of Addenda: 0

## 2017-12-26 NOTE — H&P (Signed)
Brandon Vega is an 67 y.o. male.   Chief Complaint: Patient is here for colonoscopy. HPI: Patient 67 year old Caucasian male who is here for screening colonoscopy.  He denies abdominal pain change in bowel habits or rectal bleeding. Family history is negative for CRC.  Past Medical History:  Diagnosis Date  . Allergy   . Hypertension     Past Surgical History:  Procedure Laterality Date  . VASECTOMY      Family History  Problem Relation Age of Onset  . Cancer Mother        throat  . Hypertension Father   . Diabetes Father   . Hyperlipidemia Father   . Cancer Father        lung  . Cancer Other        breast  . Heart attack Other    Social History:  reports that he has been smoking pipe. He has never used smokeless tobacco. He reports that he drinks about 1.0 standard drinks of alcohol per week. He reports that he does not use drugs.  Allergies: No Known Allergies  Medications Prior to Admission  Medication Sig Dispense Refill  . cetirizine (ZYRTEC) 10 MG tablet Take 10 mg by mouth daily as needed for allergies.    Marland Kitchen enalapril (VASOTEC) 5 MG tablet One tablet po QHS (Patient taking differently: Take 5 mg by mouth at bedtime. ) 30 tablet 5  . Multiple Vitamins-Minerals (CENTRUM SILVER PO) Take 1 tablet by mouth daily.    Marland Kitchen tiZANidine (ZANAFLEX) 4 MG capsule Take one three times a day for 10 days (Patient not taking: Reported on 12/18/2017) 30 capsule 0    No results found for this or any previous visit (from the past 48 hour(s)). No results found.  ROS  Blood pressure 117/80, pulse (!) 55, temperature 98.4 F (36.9 C), temperature source Oral, resp. rate 16, height 5\' 9"  (1.753 m), weight 86.2 kg, SpO2 97 %. Physical Exam  Constitutional: He appears well-developed and well-nourished.  HENT:  Mouth/Throat: Oropharynx is clear and moist.  Eyes: Conjunctivae are normal. No scleral icterus.  Neck: No thyromegaly present.  Cardiovascular: Normal rate, regular rhythm and  normal heart sounds.  No murmur heard. Respiratory: Effort normal and breath sounds normal.  GI: Soft. He exhibits no distension and no mass. There is no tenderness.  Musculoskeletal: He exhibits no edema.  Lymphadenopathy:    He has no cervical adenopathy.  Neurological: He is alert.  Skin: Skin is warm and dry.     Assessment/Plan Average risk screening colonoscopy.  Hildred Laser, MD 12/26/2017, 9:07 AM

## 2017-12-26 NOTE — Discharge Instructions (Signed)
Resume usual medications as before. °High-fiber diet. °No driving for 24 hours. °Next screening exam in 10 years. ° ° °Colonoscopy, Adult, Care After °This sheet gives you information about how to care for yourself after your procedure. Your doctor may also give you more specific instructions. If you have problems or questions, call your doctor. °Follow these instructions at home: °General instructions ° °· For the first 24 hours after the procedure: °? Do not drive or use machinery. °? Do not sign important documents. °? Do not drink alcohol. °? Do your daily activities more slowly than normal. °? Eat foods that are soft and easy to digest. °? Rest often. °· Take over-the-counter or prescription medicines only as told by your doctor. °· It is up to you to get the results of your procedure. Ask your doctor, or the department performing the procedure, when your results will be ready. °To help cramping and bloating: °· Try walking around. °· Put heat on your belly (abdomen) as told by your doctor. Use a heat source that your doctor recommends, such as a moist heat pack or a heating pad. °? Put a towel between your skin and the heat source. °? Leave the heat on for 20-30 minutes. °? Remove the heat if your skin turns bright red. This is especially important if you cannot feel pain, heat, or cold. You can get burned. °Eating and drinking °· Drink enough fluid to keep your pee (urine) clear or pale yellow. °· Return to your normal diet as told by your doctor. Avoid heavy or fried foods that are hard to digest. °· Avoid drinking alcohol for as long as told by your doctor. °Contact a doctor if: °· You have blood in your poop (stool) 2-3 days after the procedure. °Get help right away if: °· You have more than a small amount of blood in your poop. °· You see large clumps of tissue (blood clots) in your poop. °· Your belly is swollen. °· You feel sick to your stomach (nauseous). °· You throw up (vomit). °· You have a  fever. °· You have belly pain that gets worse, and medicine does not help your pain. °This information is not intended to replace advice given to you by your health care provider. Make sure you discuss any questions you have with your health care provider. °Document Released: 06/02/2010 Document Revised: 01/23/2016 Document Reviewed: 01/23/2016 °Elsevier Interactive Patient Education © 2017 [employer] ° °Hemorrhoids °Hemorrhoids are swollen veins in and around the rectum or anus. There are two types of hemorrhoids: °· Internal hemorrhoids. These occur in the veins that are just inside the rectum. They may poke through to the outside and become irritated and painful. °· External hemorrhoids. These occur in the veins that are outside of the anus and can be felt as a painful swelling or hard lump near the anus. ° °Most hemorrhoids do not cause serious problems, and they can be managed with home treatments such as diet and lifestyle changes. If home treatments do not help your symptoms, procedures can be done to shrink or remove the hemorrhoids. °What are the causes? °This condition is caused by increased pressure in the anal area. This pressure may result from various things, including: °· Constipation. °· Straining to have a bowel movement. °· Diarrhea. °· Pregnancy. °· Obesity. °· Sitting for long periods of time. °· Heavy lifting or other activity that causes you to strain. °· Anal sex. ° °What are the signs or symptoms? °Symptoms of this   condition include: °· Pain. °· Anal itching or irritation. °· Rectal bleeding. °· Leakage of stool (feces). °· Anal swelling. °· One or more lumps around the anus. ° °How is this diagnosed? °This condition can often be diagnosed through a visual exam. Other exams or tests may also be done, such as: °· Examination of the rectal area with a gloved hand (digital rectal exam). °· Examination of the anal canal using a small tube (anoscope). °· A blood test, if you have lost a  significant amount of blood. °· A test to look inside the colon (sigmoidoscopy or colonoscopy). ° °How is this treated? °This condition can usually be treated at home. However, various procedures may be done if dietary changes, lifestyle changes, and other home treatments do not help your symptoms. These procedures can help make the hemorrhoids smaller or remove them completely. Some of these procedures involve surgery, and others do not. Common procedures include: °· Rubber band ligation. Rubber bands are placed at the base of the hemorrhoids to cut off the blood supply to them. °· Sclerotherapy. Medicine is injected into the hemorrhoids to shrink them. °· Infrared coagulation. A type of light energy is used to get rid of the hemorrhoids. °· Hemorrhoidectomy surgery. The hemorrhoids are surgically removed, and the veins that supply them are tied off. °· Stapled hemorrhoidopexy surgery. A circular stapling device is used to remove the hemorrhoids and use staples to cut off the blood supply to them. ° °Follow these instructions at home: °Eating and drinking °· Eat foods that have a lot of fiber in them, such as whole grains, beans, nuts, fruits, and vegetables. Ask your health care provider about taking products that have added fiber (fiber supplements). °· Drink enough fluid to keep your urine clear or pale yellow. °Managing pain and swelling °· Take warm sitz baths for 20 minutes, 3-4 times a day to ease pain and discomfort. °· If directed, apply ice to the affected area. Using ice packs between sitz baths may be helpful. °? Put ice in a plastic bag. °? Place a towel between your skin and the bag. °? Leave the ice on for 20 minutes, 2-3 times a day. °General instructions °· Take over-the-counter and prescription medicines only as told by your health care provider. °· Use medicated creams or suppositories as told. °· Exercise regularly. °· Go to the bathroom when you have the urge to have a bowel movement. Do not  wait. °· Avoid straining to have bowel movements. °· Keep the anal area dry and clean. Use wet toilet paper or moist towelettes after a bowel movement. °· Do not sit on the toilet for long periods of time. This increases blood pooling and pain. °Contact a health care provider if: °· You have increasing pain and swelling that are not controlled by treatment or medicine. °· You have uncontrolled bleeding. °· You have difficulty having a bowel movement, or you are unable to have a bowel movement. °· You have pain or inflammation outside the area of the hemorrhoids. °This information is not intended to replace advice given to you by your health care provider. Make sure you discuss any questions you have with your health care provider. °Document Released: 04/27/2000 Document Revised: 09/28/2015 Document Reviewed: 01/12/2015 °Elsevier Interactive Patient Education © 2018 [employer] ° °Diverticulosis °Diverticulosis is a condition that develops when small pouches (diverticula) form in the wall of the large intestine (colon). The colon is where water is absorbed and stool is formed. The pouches form   when the inside layer of the colon pushes through weak spots in the outer layers of the colon. You may have a few pouches or many of them. °What are the causes? °The cause of this condition is not known. °What increases the risk? °The following factors may make you more likely to develop this condition: °· Being older than age 60. Your risk for this condition increases with age. Diverticulosis is rare among people younger than age 30. By age 80, many people have it. °· Eating a low-fiber diet. °· Having frequent constipation. °· Being overweight. °· Not getting enough exercise. °· Smoking. °· Taking over-the-counter pain medicines, like aspirin and ibuprofen. °· Having a family history of diverticulosis. ° °What are the signs or symptoms? °In most people, there are no symptoms of this condition. If you do have symptoms, they  may include: °· Bloating. °· Cramps in the abdomen. °· Constipation or diarrhea. °· Pain in the lower left side of the abdomen. ° °How is this diagnosed? °This condition is most often diagnosed during an exam for other colon problems. Because diverticulosis usually has no symptoms, it often cannot be diagnosed independently. This condition may be diagnosed by: °· Using a flexible scope to examine the colon (colonoscopy). °· Taking an X-ray of the colon after dye has been put into the colon (barium enema). °· Doing a CT scan. ° °How is this treated? °You may not need treatment for this condition if you have never developed an infection related to diverticulosis. If you have had an infection before, treatment may include: °· Eating a high-fiber diet. This may include eating more fruits, vegetables, and grains. °· Taking a fiber supplement. °· Taking a live bacteria supplement (probiotic). °· Taking medicine to relax your colon. °· Taking antibiotic medicines. ° °Follow these instructions at home: °· Drink 6-8 glasses of water or more each day to prevent constipation. °· Try not to strain when you have a bowel movement. °· If you have had an infection before: °? Eat more fiber as directed by your health care provider or your diet and nutrition specialist (dietitian). °? Take a fiber supplement or probiotic, if your health care provider approves. °· Take over-the-counter and prescription medicines only as told by your health care provider. °· If you were prescribed an antibiotic, take it as told by your health care provider. Do not stop taking the antibiotic even if you start to feel better. °· Keep all follow-up visits as told by your health care provider. This is important. °Contact a health care provider if: °· You have pain in your abdomen. °· You have bloating. °· You have cramps. °· You have not had a bowel movement in 3 days. °Get help right away if: °· Your pain gets worse. °· Your bloating becomes very  bad. °· You have a fever or chills, and your symptoms suddenly get worse. °· You vomit. °· You have bowel movements that are bloody or black. °· You have bleeding from your rectum. °Summary °· Diverticulosis is a condition that develops when small pouches (diverticula) form in the wall of the large intestine (colon). °· You may have a few pouches or many of them. °· This condition is most often diagnosed during an exam for other colon problems. °· If you have had an infection related to diverticulosis, treatment may include increasing the fiber in your diet, taking supplements, or taking medicines. °This information is not intended to replace advice given to you by your health care   provider. Make sure you discuss any questions you have with your health care provider. °Document Released: 01/26/2004 Document Revised: 03/19/2016 Document Reviewed: 03/19/2016 °Elsevier Interactive Patient Education © 2017 [employer]. ° °

## 2017-12-31 ENCOUNTER — Encounter (HOSPITAL_COMMUNITY): Payer: Self-pay | Admitting: Internal Medicine

## 2018-02-18 ENCOUNTER — Telehealth: Payer: Self-pay | Admitting: Family Medicine

## 2018-02-18 ENCOUNTER — Other Ambulatory Visit: Payer: Self-pay | Admitting: Family Medicine

## 2018-02-18 MED ORDER — AMLODIPINE BESYLATE 5 MG PO TABS: ORAL_TABLET | ORAL | 1 refills | Status: DC

## 2018-02-18 NOTE — Telephone Encounter (Signed)
Document ace inhib cough  Start norvasc 5 one qhs six mo worth

## 2018-02-18 NOTE — Telephone Encounter (Signed)
Patient thinks his medication enalapril 5 mg is causing him to cough no stop all the time and would like for it to be changed to something else. [facility]-[address]

## 2018-02-18 NOTE — Telephone Encounter (Signed)
Medication sent it to pharmacy, allergy documented and patient is aware.

## 2018-02-18 NOTE — Telephone Encounter (Signed)
Please advise 

## 2018-02-23 ENCOUNTER — Other Ambulatory Visit: Payer: Self-pay | Admitting: Family Medicine

## 2018-02-24 NOTE — Telephone Encounter (Signed)
Medication is not on med list. I called to clarify ,left vm to r/c.

## 2018-05-14 ENCOUNTER — Other Ambulatory Visit: Payer: Self-pay | Admitting: Family Medicine

## 2018-05-29 ENCOUNTER — Ambulatory Visit (INDEPENDENT_AMBULATORY_CARE_PROVIDER_SITE_OTHER): Payer: 59 | Admitting: Family Medicine

## 2018-05-29 ENCOUNTER — Encounter: Payer: Self-pay | Admitting: Family Medicine

## 2018-05-29 VITALS — BP 138/88 | Ht 69.0 in | Wt 193.0 lb

## 2018-05-29 DIAGNOSIS — I10 Essential (primary) hypertension: Secondary | ICD-10-CM

## 2018-05-29 MED ORDER — AMLODIPINE BESYLATE 5 MG PO TABS: ORAL_TABLET | ORAL | 1 refills | Status: DC

## 2018-05-29 NOTE — Progress Notes (Signed)
   Subjective:    Patient ID: Brandon Vega, male    DOB: Apr 03, 1951, 68 y.o.   MRN: [id number]  Hypertension  This is a chronic problem. There are no compliance problems.   pt checked his BP yesterday 125/75; pt checks BP once a week.   Blood pressure medicine and blood pressure levels reviewed today with patient. Compliant with blood pressure medicine. States does not miss a dose. No obvious side effects. Blood pressure generally good when checked elsewhere. Watching salt intake.  Exercise three to four d per wk   Running and weight s  styaing active    Numbers cked at hme usually 120s syst    Pt here today for 6 month follow up. Pt states his hip pain has gotten better due to going to go chiropractor. Pt states he his back to running.  Hip and joint pain better     Pt states he is having some numbness in the bottom of feet.    Review of Systems No headache, no major weight loss or weight gain, no chest pain no back pain abdominal pain no change in bowel habits complete ROS otherwise negative     Objective:   Physical Exam Alert vitals stable, NAD. Blood pressure good on repeat. HEENT normal. Lungs clear. Heart regular rate and rhythm.        Assessment & Plan:  Impression hypertension.  Good control discussed to maintain same meds.  Plan follow-up in 6 months for wellness plus chronic.

## 2018-11-21 ENCOUNTER — Telehealth: Payer: Self-pay | Admitting: Family Medicine

## 2018-11-21 DIAGNOSIS — Z125 Encounter for screening for malignant neoplasm of prostate: Secondary | ICD-10-CM

## 2018-11-21 DIAGNOSIS — I10 Essential (primary) hypertension: Secondary | ICD-10-CM

## 2018-11-21 DIAGNOSIS — Z79899 Other long term (current) drug therapy: Secondary | ICD-10-CM

## 2018-11-21 NOTE — Telephone Encounter (Signed)
Pt has active orders for CBC, Bmet. Last completed labwork PSA CBC TSH Lipid Liver and Bmet on 08/08/2018. Please advise. Thank you

## 2018-11-21 NOTE — Telephone Encounter (Signed)
Pt has CPE scheduled for 8/11 and would like to know if Dr. Richardson Landry wants him to have lab work done

## 2018-11-24 ENCOUNTER — Encounter: Payer: 59 | Admitting: Family Medicine

## 2018-11-24 NOTE — Telephone Encounter (Signed)
Cancel these two so lab will not screw up. psa cbc lip liv met 7

## 2018-11-24 NOTE — Telephone Encounter (Signed)
Blood work ordered in EPIC. Patient notified. 

## 2018-12-08 ENCOUNTER — Ambulatory Visit (INDEPENDENT_AMBULATORY_CARE_PROVIDER_SITE_OTHER): Payer: POS | Admitting: Otolaryngology

## 2018-12-08 ENCOUNTER — Other Ambulatory Visit: Payer: Self-pay

## 2018-12-08 DIAGNOSIS — H903 Sensorineural hearing loss, bilateral: Secondary | ICD-10-CM | POA: Diagnosis not present

## 2018-12-08 DIAGNOSIS — H6122 Impacted cerumen, left ear: Secondary | ICD-10-CM | POA: Diagnosis not present

## 2018-12-08 DIAGNOSIS — H9313 Tinnitus, bilateral: Secondary | ICD-10-CM | POA: Diagnosis not present

## 2018-12-16 LAB — CBC WITH DIFFERENTIAL/PLATELET
Basophils Absolute: 0.1 10*3/uL (ref 0.0–0.2)
Basos: 1 %
EOS (ABSOLUTE): 0.3 10*3/uL (ref 0.0–0.4)
Eos: 2 %
Hematocrit: 43.3 % (ref 37.5–51.0)
Hemoglobin: 14.6 g/dL (ref 13.0–17.7)
Immature Grans (Abs): 0 10*3/uL (ref 0.0–0.1)
Immature Granulocytes: 0 %
Lymphocytes Absolute: 6.1 10*3/uL — ABNORMAL HIGH (ref 0.7–3.1)
Lymphs: 44 %
MCH: 30.8 pg (ref 26.6–33.0)
MCHC: 33.7 g/dL (ref 31.5–35.7)
MCV: 91 fL (ref 79–97)
Monocytes Absolute: 0.8 10*3/uL (ref 0.1–0.9)
Monocytes: 6 %
Neutrophils Absolute: 6.5 10*3/uL (ref 1.4–7.0)
Neutrophils: 47 %
Platelets: 275 10*3/uL (ref 150–450)
RBC: 4.74 x10E6/uL (ref 4.14–5.80)
RDW: 11.7 % (ref 11.6–15.4)
WBC: 13.7 10*3/uL — ABNORMAL HIGH (ref 3.4–10.8)

## 2018-12-16 LAB — LIPID PANEL
Chol/HDL Ratio: 3.6 ratio (ref 0.0–5.0)
Cholesterol, Total: 175 mg/dL (ref 100–199)
HDL: 48 mg/dL (ref >39)
LDL Calculated: 109 mg/dL — ABNORMAL HIGH (ref 0–99)
Triglycerides: 88 mg/dL (ref 0–149)
VLDL Cholesterol Cal: 18 mg/dL (ref 5–40)

## 2018-12-16 LAB — BASIC METABOLIC PANEL
BUN/Creatinine Ratio: 14 (ref 10–24)
BUN: 12 mg/dL (ref 8–27)
CO2: 22 mmol/L (ref 20–29)
Calcium: 9.6 mg/dL (ref 8.6–10.2)
Chloride: 102 mmol/L (ref 96–106)
Creatinine, Ser: 0.87 mg/dL (ref 0.76–1.27)
GFR calc Af Amer: 101 mL/min/{1.73_m2} (ref >59)
GFR calc non Af Amer: 87 mL/min/{1.73_m2} (ref >59)
Glucose: 98 mg/dL (ref 65–99)
Potassium: 4.3 mmol/L (ref 3.5–5.2)
Sodium: 139 mmol/L (ref 134–144)

## 2018-12-16 LAB — HEPATIC FUNCTION PANEL
ALT: 17 IU/L (ref 0–44)
AST: 23 IU/L (ref 0–40)
Albumin: 4.2 g/dL (ref 3.8–4.8)
Alkaline Phosphatase: 87 IU/L (ref 39–117)
Bilirubin Total: 1.2 mg/dL (ref 0.0–1.2)
Bilirubin, Direct: 0.28 mg/dL (ref 0.00–0.40)
Total Protein: 7.5 g/dL (ref 6.0–8.5)

## 2018-12-16 LAB — PSA: Prostate Specific Ag, Serum: 0.3 ng/mL (ref 0.0–4.0)

## 2018-12-23 ENCOUNTER — Other Ambulatory Visit: Payer: Self-pay

## 2018-12-23 ENCOUNTER — Ambulatory Visit (INDEPENDENT_AMBULATORY_CARE_PROVIDER_SITE_OTHER): Payer: 59 | Admitting: Family Medicine

## 2018-12-23 ENCOUNTER — Encounter: Payer: Self-pay | Admitting: Family Medicine

## 2018-12-23 VITALS — BP 116/84 | Temp 98.5°F | Ht 69.0 in | Wt 181.0 lb

## 2018-12-23 DIAGNOSIS — D72820 Lymphocytosis (symptomatic): Secondary | ICD-10-CM | POA: Diagnosis not present

## 2018-12-23 DIAGNOSIS — Z0000 Encounter for general adult medical examination without abnormal findings: Secondary | ICD-10-CM | POA: Diagnosis not present

## 2018-12-23 DIAGNOSIS — I10 Essential (primary) hypertension: Secondary | ICD-10-CM | POA: Diagnosis not present

## 2018-12-23 NOTE — Patient Instructions (Addendum)
Results for orders placed or performed in visit on 11/21/18  Lipid panel  Result Value Ref Range   Cholesterol, Total 175 100 - 199 mg/dL   Triglycerides 88 0 - 149 mg/dL   HDL 48 >39 mg/dL   VLDL Cholesterol Cal 18 5 - 40 mg/dL   LDL Calculated 109 (H) 0 - 99 mg/dL   Chol/HDL Ratio 3.6 0.0 - 5.0 ratio  Hepatic function panel  Result Value Ref Range   Total Protein 7.5 6.0 - 8.5 g/dL   Albumin 4.2 3.8 - 4.8 g/dL   Bilirubin Total 1.2 0.0 - 1.2 mg/dL   Bilirubin, Direct 0.28 0.00 - 0.40 mg/dL   Alkaline Phosphatase 87 39 - 117 IU/L   AST 23 0 - 40 IU/L   ALT 17 0 - 44 IU/L  Basic metabolic panel  Result Value Ref Range   Glucose 98 65 - 99 mg/dL   BUN 12 8 - 27 mg/dL   Creatinine, Ser 0.87 0.76 - 1.27 mg/dL   GFR calc non Af Amer 87 >59 mL/min/1.73   GFR calc Af Amer 101 >59 mL/min/1.73   BUN/Creatinine Ratio 14 10 - 24   Sodium 139 134 - 144 mmol/L   Potassium 4.3 3.5 - 5.2 mmol/L   Chloride 102 96 - 106 mmol/L   CO2 22 20 - 29 mmol/L   Calcium 9.6 8.6 - 10.2 mg/dL  PSA  Result Value Ref Range   Prostate Specific Ag, Serum 0.3 0.0 - 4.0 ng/mL  CBC with Differential/Platelet  Result Value Ref Range   WBC 13.7 (H) 3.4 - 10.8 x10E3/uL   RBC 4.74 4.14 - 5.80 x10E6/uL   Hemoglobin 14.6 13.0 - 17.7 g/dL   Hematocrit 43.3 37.5 - 51.0 %   MCV 91 79 - 97 fL   MCH 30.8 26.6 - 33.0 pg   MCHC 33.7 31.5 - 35.7 g/dL   RDW 11.7 11.6 - 15.4 %   Platelets 275 150 - 450 x10E3/uL   Neutrophils 47 Not Estab. %   Lymphs 44 Not Estab. %   Monocytes 6 Not Estab. %   Eos 2 Not Estab. %   Basos 1 Not Estab. %   Neutrophils Absolute 6.5 1.4 - 7.0 x10E3/uL   Lymphocytes Absolute 6.1 (H) 0.7 - 3.1 x10E3/uL   Monocytes Absolute 0.8 0.1 - 0.9 x10E3/uL   EOS (ABSOLUTE) 0.3 0.0 - 0.4 x10E3/uL   Basophils Absolute 0.1 0.0 - 0.2 x10E3/uL   Immature Granulocytes 0 Not Estab. %   Immature Grans (Abs) 0.0 0.0 - 0.1 x10E3/uL     I am concerned that the changes we are seeing here in the cbc  could be suggestive of early CLL, also known as chronic lymphocitic leukemia. This is common in older folks and thankfully rarely  Leads to serious complications for the patient. I may be overcalling this, but definitely I recommend a good work up by a Research scientist (physical sciences) to see exactly what is going on 1 view

## 2018-12-23 NOTE — Progress Notes (Signed)
Subjective:    Patient ID: Brandon Vega, male    DOB: Jul 21, 1950, 68 y.o.   MRN: [id number]  HPI The patient comes in today for a wellness visit.    A review of their health history was completed.  A review of medications was also completed.  Any needed refills; none  Eating habits: health conscious  Falls/  MVA accidents in past few months: none  Regular exercise: runs, push ups, pull ups  Specialist pt sees on regular basis: none  Preventative health issues were discussed.   Additional concerns: should he take aspirin or not  Blood pressure medicine and blood pressure levels reviewed today with patient. Compliant with blood pressure medicine. States does not miss a dose. No obvious side effects. Blood pressure generally good when checked elsewhere. Watching salt intake.  Results for orders placed or performed in visit on 11/21/18  Lipid panel  Result Value Ref Range   Cholesterol, Total 175 100 - 199 mg/dL   Triglycerides 88 0 - 149 mg/dL   HDL 48 >39 mg/dL   VLDL Cholesterol Cal 18 5 - 40 mg/dL   LDL Calculated 109 (H) 0 - 99 mg/dL   Chol/HDL Ratio 3.6 0.0 - 5.0 ratio  Hepatic function panel  Result Value Ref Range   Total Protein 7.5 6.0 - 8.5 g/dL   Albumin 4.2 3.8 - 4.8 g/dL   Bilirubin Total 1.2 0.0 - 1.2 mg/dL   Bilirubin, Direct 0.28 0.00 - 0.40 mg/dL   Alkaline Phosphatase 87 39 - 117 IU/L   AST 23 0 - 40 IU/L   ALT 17 0 - 44 IU/L  Basic metabolic panel  Result Value Ref Range   Glucose 98 65 - 99 mg/dL   BUN 12 8 - 27 mg/dL   Creatinine, Ser 0.87 0.76 - 1.27 mg/dL   GFR calc non Af Amer 87 >59 mL/min/1.73   GFR calc Af Amer 101 >59 mL/min/1.73   BUN/Creatinine Ratio 14 10 - 24   Sodium 139 134 - 144 mmol/L   Potassium 4.3 3.5 - 5.2 mmol/L   Chloride 102 96 - 106 mmol/L   CO2 22 20 - 29 mmol/L   Calcium 9.6 8.6 - 10.2 mg/dL  PSA  Result Value Ref Range   Prostate Specific Ag, Serum 0.3 0.0 - 4.0 ng/mL  CBC with Differential/Platelet  Result  Value Ref Range   WBC 13.7 (H) 3.4 - 10.8 x10E3/uL   RBC 4.74 4.14 - 5.80 x10E6/uL   Hemoglobin 14.6 13.0 - 17.7 g/dL   Hematocrit 43.3 37.5 - 51.0 %   MCV 91 79 - 97 fL   MCH 30.8 26.6 - 33.0 pg   MCHC 33.7 31.5 - 35.7 g/dL   RDW 11.7 11.6 - 15.4 %   Platelets 275 150 - 450 x10E3/uL   Neutrophils 47 Not Estab. %   Lymphs 44 Not Estab. %   Monocytes 6 Not Estab. %   Eos 2 Not Estab. %   Basos 1 Not Estab. %   Neutrophils Absolute 6.5 1.4 - 7.0 x10E3/uL   Lymphocytes Absolute 6.1 (H) 0.7 - 3.1 x10E3/uL   Monocytes Absolute 0.8 0.1 - 0.9 x10E3/uL   EOS (ABSOLUTE) 0.3 0.0 - 0.4 x10E3/uL   Basophils Absolute 0.1 0.0 - 0.2 x10E3/uL   Immature Granulocytes 0 Not Estab. %   Immature Grans (Abs) 0.0 0.0 - 0.1 x10E3/uL   Blood pressure medicine and blood pressure levels reviewed today with patient. Compliant with blood pressure medicine.  States does not miss a dose. No obvious side effects. Blood pressure generally good when checked elsewhere. Watching salt intake.    Review of Systems  Constitutional: Negative for activity change, appetite change and fever.  HENT: Negative for congestion and rhinorrhea.   Eyes: Negative for discharge.  Respiratory: Negative for cough and wheezing.   Cardiovascular: Negative for chest pain.  Gastrointestinal: Negative for abdominal pain, blood in stool and vomiting.  Genitourinary: Negative for difficulty urinating and frequency.  Musculoskeletal: Negative for neck pain.  Skin: Negative for rash.  Allergic/Immunologic: Negative for environmental allergies and food allergies.  Neurological: Negative for weakness and headaches.  Psychiatric/Behavioral: Negative for agitation.  All other systems reviewed and are negative.      Objective:   Physical Exam Vitals signs reviewed.  Constitutional:      Appearance: He is well-developed.  HENT:     Head: Normocephalic and atraumatic.     Right Ear: External ear normal.     Left Ear: External ear  normal.     Nose: Nose normal.  Eyes:     Pupils: Pupils are equal, round, and reactive to light.  Neck:     Musculoskeletal: Normal range of motion and neck supple.     Thyroid: No thyromegaly.  Cardiovascular:     Rate and Rhythm: Normal rate and regular rhythm.     Heart sounds: Normal heart sounds. No murmur.  Pulmonary:     Effort: Pulmonary effort is normal. No respiratory distress.     Breath sounds: Normal breath sounds. No wheezing.  Abdominal:     General: Bowel sounds are normal. There is no distension.     Palpations: Abdomen is soft. There is no mass.     Tenderness: There is no abdominal tenderness.  Genitourinary:    Penis: Normal.   Musculoskeletal: Normal range of motion.  Lymphadenopathy:     Cervical: No cervical adenopathy.  Skin:    General: Skin is warm and dry.     Findings: No erythema.  Neurological:     Mental Status: He is alert.     Motor: No abnormal muscle tone.  Psychiatric:        Behavior: Behavior normal.        Judgment: Judgment normal.           Assessment & Plan:  Impression #1 wellness exam.  Diet discussed.  Exercise discussed.  Up-to-date on colonoscopy.  Vaccines discussed and administered symptom care discussed  2.  Hypertension.  Blood pressure good control.  Compliant with medication.  Salt intake discussed.  Exercise discussed.  Maintain same meds follow-up in 6 months in this regard  3.  CBC reveals a lymphocytosis with 13,000 white blood count.  Implications of this discussed with patient.  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  It could be another lymphocytosis due to another etiology.  Discussed with patient.  Definitely needs hematology work-up.  Long discussion held implications discussed

## 2018-12-24 ENCOUNTER — Other Ambulatory Visit: Payer: Self-pay | Admitting: *Deleted

## 2018-12-24 DIAGNOSIS — D72820 Lymphocytosis (symptomatic): Secondary | ICD-10-CM

## 2019-01-07 ENCOUNTER — Inpatient Hospital Stay (HOSPITAL_COMMUNITY): Payer: Medicare Other | Attending: Hematology | Admitting: Hematology

## 2019-01-07 ENCOUNTER — Inpatient Hospital Stay (HOSPITAL_COMMUNITY): Payer: Medicare Other

## 2019-01-07 ENCOUNTER — Other Ambulatory Visit: Payer: Self-pay

## 2019-01-07 ENCOUNTER — Encounter (HOSPITAL_COMMUNITY): Payer: Self-pay | Admitting: Hematology

## 2019-01-07 VITALS — BP 121/68 | HR 59 | Temp 97.7°F | Resp 16 | Wt 182.0 lb

## 2019-01-07 DIAGNOSIS — D72829 Elevated white blood cell count, unspecified: Secondary | ICD-10-CM | POA: Diagnosis present

## 2019-01-07 DIAGNOSIS — D72820 Lymphocytosis (symptomatic): Secondary | ICD-10-CM

## 2019-01-07 LAB — CBC WITH DIFFERENTIAL/PLATELET
Abs Immature Granulocytes: 0.03 10*3/uL (ref 0.00–0.07)
Basophils Absolute: 0.1 10*3/uL (ref 0.0–0.1)
Basophils Relative: 1 %
Eosinophils Absolute: 0.3 10*3/uL (ref 0.0–0.5)
Eosinophils Relative: 2 %
HCT: 44.1 % (ref 39.0–52.0)
Hemoglobin: 13.9 g/dL (ref 13.0–17.0)
Immature Granulocytes: 0 %
Lymphocytes Relative: 48 %
Lymphs Abs: 5.8 10*3/uL — ABNORMAL HIGH (ref 0.7–4.0)
MCH: 30.4 pg (ref 26.0–34.0)
MCHC: 31.5 g/dL (ref 30.0–36.0)
MCV: 96.5 fL (ref 80.0–100.0)
Monocytes Absolute: 0.8 10*3/uL (ref 0.1–1.0)
Monocytes Relative: 7 %
Neutro Abs: 5.2 10*3/uL (ref 1.7–7.7)
Neutrophils Relative %: 42 %
Platelets: 280 10*3/uL (ref 150–400)
RBC: 4.57 MIL/uL (ref 4.22–5.81)
RDW: 12.9 % (ref 11.5–15.5)
WBC: 12.2 10*3/uL — ABNORMAL HIGH (ref 4.0–10.5)
nRBC: 0 % (ref 0.0–0.2)

## 2019-01-07 LAB — RETICULOCYTES
Immature Retic Fract: 11.2 % (ref 2.3–15.9)
RBC.: 4.57 MIL/uL (ref 4.22–5.81)
Retic Count, Absolute: 53.5 10*3/uL (ref 19.0–186.0)
Retic Ct Pct: 1.2 % (ref 0.4–3.1)

## 2019-01-07 LAB — LACTATE DEHYDROGENASE: LDH: 123 U/L (ref 98–192)

## 2019-01-07 NOTE — Assessment & Plan Note (Signed)
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  Normal hemoglobin and platelet count. - CBC on 08/07/2017 shows white count of 11.7, ALC of 5600. - He denies any fevers or night sweats.  Lost 10 pounds since March and attributes to stopping lifting weights at the gym. - Physical examination today did not reveal any palpable adenopathy or splenomegaly. - Denies any exposure to chemicals/pesticides.  Physically very active, runs every day and exercise on regular basis. - I have discussed the differential diagnosis of lymphocytosis with the patient in detail including CLL and other lymphoproliferative disorders..  We will repeat CBC with differential and LDH level.  We will also send flow cytometry of peripheral blood to arrive at diagnosis. -We will see him back in 2 to 3 weeks to discuss the results.  2.  Health maintenance: -Colonoscopy on 12/26/2017 shows diverticulosis of the sigmoid colon and external hemorrhoids.  3.  Smoking history: - Does not smoke cigarettes.  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

## 2019-01-07 NOTE — Progress Notes (Signed)
CONSULT NOTE  Patient Care Team: Mikey Kirschner, MD as PCP - General (Family Medicine)  CHIEF COMPLAINTS/PURPOSE OF CONSULTATION:  Leukocytosis.  HISTORY OF PRESENTING ILLNESS:  Brandon Vega 68 y.o. male is seen in consultation today at the request of Dr. Wolfgang Phoenix for elevated white count.  Recent CBC on 12/15/2018 showed white count of 13.7, differential showing absolute lymphocyte count of 6100.  Hemoglobin and platelet count was normal.  Review of his CBC from March 2019 showed white count of 11.7, absolute lymphocyte count of 5600.  Denies any recurrent infections or hospitalizations.  Denies any fevers or night sweats but lost about 10 pounds since March as he stopped lifting weights.  He reported feeling occasionally lightheaded for few seconds, in the last few weeks.  Otherwise he is very physically active and runs every day.  He is a retired Scientist, research (medical) and denies any exposure to chemicals or pesticides.  He smokes pipe daily for 30 years.  Family history significant for father who died of lung cancer and was a smoker.  Mother also smoked and died of throat cancer.  Maternal uncle died of acute leukemia.  His only prescription medication is Norvasc 5 mg daily.  Appetite is reported 100%.  Energy levels are 50%.  Numbness in the left foot from previous sciatica is stable.  Colonoscopy was done in August 2019.  Denies any bleeding per rectum or melena.  Denies any change in bowel habits.  Denies palpating any lumps in the body.    MEDICAL HISTORY:  Past Medical History:  Diagnosis Date  . Allergy   . ED (erectile dysfunction)     SURGICAL HISTORY: Past Surgical History:  Procedure Laterality Date  . COLONOSCOPY N/A 12/26/2017   Procedure: COLONOSCOPY;  Surgeon: Rogene Houston, MD;  Location: [facility];  Service: Endoscopy;  Laterality: N/A;  930  . VASECTOMY      SOCIAL HISTORY: Social History   Socioeconomic History  . Marital status: Married    Spouse name: Not on  file  . Number of children: Not on file  . Years of education: Not on file  . Highest education level: Not on file  Occupational History  . Not on file  Social Needs  . Financial resource strain: Not on file  . Food insecurity    Worry: Not on file    Inability: Not on file  . Transportation needs    Medical: Not on file    Non-medical: Not on file  Tobacco Use  . Smoking status: Current Every Day Smoker    Types: Pipe  . Smokeless tobacco: Never Used  Substance and Sexual Activity  . Alcohol use: Yes    Alcohol/week: 1.0 standard drinks    Types: 1 Glasses of wine per week  . Drug use: Never  . Sexual activity: Not on file  Lifestyle  . Physical activity    Days per week: Not on file    Minutes per session: Not on file  . Stress: Not on file  Relationships  . Social Herbalist on phone: Not on file    Gets together: Not on file    Attends religious service: Not on file    Active member of club or organization: Not on file    Attends meetings of clubs or organizations: Not on file    Relationship status: Not on file  . Intimate partner violence    Fear of current or ex partner: Not  on file    Emotionally abused: Not on file    Physically abused: Not on file    Forced sexual activity: Not on file  Other Topics Concern  . Not on file  Social History Narrative  . Not on file    FAMILY HISTORY: Family History  Problem Relation Age of Onset  . Cancer Mother        throat  . Hypertension Father   . Diabetes Father   . Hyperlipidemia Father   . Cancer Father        lung  . Cancer Other        breast  . Heart attack Other     ALLERGIES:  is allergic to ace inhibitors.  MEDICATIONS:  Current Outpatient Medications  Medication Sig Dispense Refill  . amLODipine (NORVASC) 5 MG tablet Take one tablet at bedtime 90 tablet 1  . aspirin EC 81 MG tablet Take 81 mg by mouth daily.    . Misc Natural Products ([place]) Prostate Health    .  Multiple Vitamins-Minerals (CENTRUM SILVER PO) Take 1 tablet by mouth daily.     No current facility-administered medications for this visit.     REVIEW OF SYSTEMS:   Constitutional: Denies fevers, chills or abnormal night sweats.  Positive for 10 pound weight loss in the last 6 months. Eyes: Denies blurriness of vision, double vision or watery eyes Ears, nose, mouth, throat, and face: Denies mucositis or sore throat Respiratory: Denies cough, dyspnea or wheezes Cardiovascular: Denies palpitation, chest discomfort or lower extremity swelling Gastrointestinal:  Denies nausea, heartburn or change in bowel habits Skin: Denies abnormal skin rashes Lymphatics: Denies new lymphadenopathy or easy bruising Neurological: Denies any focal weakness.  Numbness in the left foot from sciatica present.  Occasional dizziness, started recently. Behavioral/Psych: Mood is stable, no new changes  All other systems were reviewed with the patient and are negative.  PHYSICAL EXAMINATION: ECOG PERFORMANCE STATUS: 1 - Symptomatic but completely ambulatory  Vitals:   01/07/19 1306  BP: 121/68  Pulse: (!) 59  Resp: 16  Temp: 97.7 F (36.5 C)  SpO2: 99%   Filed Weights   01/07/19 1306  Weight: 182 lb (82.6 kg)    GENERAL:alert, no distress and comfortable SKIN: skin color, texture, turgor are normal, no rashes or significant lesions EYES: normal, conjunctiva are pink and non-injected, sclera clear OROPHARYNX:no exudate, no erythema and lips, buccal mucosa, and tongue normal  NECK: supple, thyroid normal size, non-tender, without nodularity LYMPH:  no palpable lymphadenopathy in the cervical, axillary or inguinal LUNGS: clear to auscultation and percussion with normal breathing effort HEART: regular rate & rhythm and no murmurs and no lower extremity edema ABDOMEN:abdomen soft, non-tender and normal bowel sounds Musculoskeletal:no cyanosis of digits and no clubbing  PSYCH: alert & oriented x 3 with  fluent speech NEURO: no focal motor/sensory deficits  LABORATORY DATA:  I have reviewed the data as listed Recent Results (from the past 2160 hour(s))  Lipid panel     Status: Abnormal   Collection Time: 12/15/18  9:53 AM  Result Value Ref Range   Cholesterol, Total 175 100 - 199 mg/dL   Triglycerides 88 0 - 149 mg/dL   HDL 48 >39 mg/dL   VLDL Cholesterol Cal 18 5 - 40 mg/dL   LDL Calculated 109 (H) 0 - 99 mg/dL   Chol/HDL Ratio 3.6 0.0 - 5.0 ratio    Comment:  T. Chol/HDL Ratio                                             Men  Women                               1/2 Avg.Risk  3.4    3.3                                   Avg.Risk  5.0    4.4                                2X Avg.Risk  9.6    7.1                                3X Avg.Risk 23.4   11.0   Hepatic function panel     Status: None   Collection Time: 12/15/18  9:53 AM  Result Value Ref Range   Total Protein 7.5 6.0 - 8.5 g/dL   Albumin 4.2 3.8 - 4.8 g/dL   Bilirubin Total 1.2 0.0 - 1.2 mg/dL   Bilirubin, Direct 0.28 0.00 - 0.40 mg/dL   Alkaline Phosphatase 87 39 - 117 IU/L   AST 23 0 - 40 IU/L   ALT 17 0 - 44 IU/L  Basic metabolic panel     Status: None   Collection Time: 12/15/18  9:53 AM  Result Value Ref Range   Glucose 98 65 - 99 mg/dL   BUN 12 8 - 27 mg/dL   Creatinine, Ser 0.87 0.76 - 1.27 mg/dL   GFR calc non Af Amer 87 >59 mL/min/1.73   GFR calc Af Amer 101 >59 mL/min/1.73   BUN/Creatinine Ratio 14 10 - 24   Sodium 139 134 - 144 mmol/L   Potassium 4.3 3.5 - 5.2 mmol/L   Chloride 102 96 - 106 mmol/L   CO2 22 20 - 29 mmol/L   Calcium 9.6 8.6 - 10.2 mg/dL  PSA     Status: None   Collection Time: 12/15/18  9:53 AM  Result Value Ref Range   Prostate Specific Ag, Serum 0.3 0.0 - 4.0 ng/mL    Comment: Roche ECLIA methodology. According to the American Urological Association, Serum PSA should decrease and remain at undetectable levels after radical prostatectomy. The AUA  defines biochemical recurrence as an initial PSA value 0.2 ng/mL or greater followed by a subsequent confirmatory PSA value 0.2 ng/mL or greater. Values obtained with different assay methods or kits cannot be used interchangeably. Results cannot be interpreted as absolute evidence of the presence or absence of malignant disease.   CBC with Differential/Platelet     Status: Abnormal   Collection Time: 12/15/18  9:53 AM  Result Value Ref Range   WBC 13.7 (H) 3.4 - 10.8 x10E3/uL   RBC 4.74 4.14 - 5.80 x10E6/uL   Hemoglobin 14.6 13.0 - 17.7 g/dL   Hematocrit 43.3 37.5 - 51.0 %   MCV 91 79 - 97 fL   MCH 30.8 26.6 - 33.0 pg   MCHC 33.7 31.5 - 35.7 g/dL   RDW 11.7 11.6 -  15.4 %   Platelets 275 150 - 450 x10E3/uL   Neutrophils 47 Not Estab. %   Lymphs 44 Not Estab. %   Monocytes 6 Not Estab. %   Eos 2 Not Estab. %   Basos 1 Not Estab. %   Neutrophils Absolute 6.5 1.4 - 7.0 x10E3/uL   Lymphocytes Absolute 6.1 (H) 0.7 - 3.1 x10E3/uL   Monocytes Absolute 0.8 0.1 - 0.9 x10E3/uL   EOS (ABSOLUTE) 0.3 0.0 - 0.4 x10E3/uL   Basophils Absolute 0.1 0.0 - 0.2 x10E3/uL   Immature Granulocytes 0 Not Estab. %   Immature Grans (Abs) 0.0 0.0 - 0.1 x10E3/uL    RADIOGRAPHIC STUDIES: I have personally reviewed the radiological images as listed and agreed with the findings in the report.  ASSESSMENT & PLAN:  Leukocytosis 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  Normal hemoglobin and platelet count. - CBC on 08/07/2017 shows white count of 11.7, ALC of 5600. - He denies any fevers or night sweats.  Lost 10 pounds since March and attributes to stopping lifting weights at the gym. - Physical examination today did not reveal any palpable adenopathy or splenomegaly. - Denies any exposure to chemicals/pesticides.  Physically very active, runs every day and exercise on regular basis. - I have discussed the differential  diagnosis of lymphocytosis with the patient in detail including CLL and other lymphoproliferative disorders..  We will repeat CBC with differential and LDH level.  We will also send flow cytometry of peripheral blood to arrive at diagnosis. -We will see him back in 2 to 3 weeks to discuss the results.  2.  Health maintenance: -Colonoscopy on 12/26/2017 shows diverticulosis of the sigmoid colon and external hemorrhoids.  3.  Smoking history: - Does not smoke cigarettes.  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     All questions were answered. The patient knows to call the clinic with any problems, questions or concerns.      Derek Jack, MD 01/07/19 2:05 PM

## 2019-01-07 NOTE — Patient Instructions (Signed)
[facility] at [facility] Discharge Instructions  You were seen today by Dr. Katragadda. He went over your history, family history and how you've been feeling lately. He will have blood drawn today. He will see you back in 3 weeks for follow up.   Thank you for choosing [facility] at [facility] to provide your oncology and hematology care.  To afford each patient quality time with our provider, please arrive at least 15 minutes before your scheduled appointment time.   If you have a lab appointment with the Cancer Center please come in thru the  Main Entrance and check in at the main information desk  You need to re-schedule your appointment should you arrive 10 or more minutes late.  We strive to give you quality time with our providers, and arriving late affects you and other patients whose appointments are after yours.  Also, if you no show three or more times for appointments you may be dismissed from the clinic at the providers discretion.     Again, thank you for choosing [facility].  Our hope is that these requests will decrease the amount of time that you wait before being seen by our physicians.       _____________________________________________________________  Should you have questions after your visit to [facility], please contact our office at [phone] between the hours of 8:00 a.m. and 4:30 p.m.  Voicemails left after 4:00 p.m. will not be returned until the following business day.  For prescription refill requests, have your pharmacy contact our office and allow 72 hours.    Cancer Center Support Programs:   > Cancer Support Group  2nd Tuesday of the month 1pm-2pm, Journey Room    

## 2019-01-08 DIAGNOSIS — D72829 Elevated white blood cell count, unspecified: Secondary | ICD-10-CM | POA: Diagnosis not present

## 2019-01-08 LAB — BETA 2 MICROGLOBULIN, SERUM: Beta-2 Microglobulin: 1.8 mg/L (ref 0.6–2.4)

## 2019-02-02 ENCOUNTER — Other Ambulatory Visit: Payer: Self-pay

## 2019-02-02 ENCOUNTER — Encounter (HOSPITAL_COMMUNITY): Payer: Self-pay | Admitting: Hematology

## 2019-02-02 ENCOUNTER — Inpatient Hospital Stay (HOSPITAL_COMMUNITY): Payer: POS | Attending: Hematology | Admitting: Hematology

## 2019-02-02 DIAGNOSIS — Z79899 Other long term (current) drug therapy: Secondary | ICD-10-CM | POA: Insufficient documentation

## 2019-02-02 DIAGNOSIS — Z7982 Long term (current) use of aspirin: Secondary | ICD-10-CM | POA: Insufficient documentation

## 2019-02-02 DIAGNOSIS — C9110 Chronic lymphocytic leukemia of B-cell type not having achieved remission: Secondary | ICD-10-CM | POA: Insufficient documentation

## 2019-02-02 DIAGNOSIS — F17210 Nicotine dependence, cigarettes, uncomplicated: Secondary | ICD-10-CM | POA: Insufficient documentation

## 2019-02-02 NOTE — Assessment & Plan Note (Signed)
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 -Denies any fevers or night sweats.  Lost 10 pounds since March and attributes to lifting weights at gym. -No palpable adenopathy or splenomegaly on physical exam.  No recurrent infections. -Denies any exposure to chemicals/pesticides.  Physically very active, runs every day and exercise on regular basis. -We reviewed results of the peripheral blood flow cytometry from 01/08/2019 which showed CD5 positive monoclonal B-cell population, consistent with CLL. - I talked about normal pathophysiology of CLL.  Treatment is indicated only if cytopenias, B symptoms, recurrent infections or painful lymphadenopathy. -We can continue to monitor at six-month intervals with physical exam and labs.  2.  Health maintenance: -Colonoscopy on 12/26/2017 shows diverticulosis of the sigmoid colon and external hemorrhoids.  3.  Smoking history: - Does not smoke cigarettes.  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

## 2019-02-02 NOTE — Patient Instructions (Addendum)
[facility] at [facility] Discharge Instructions  You were seen today by Dr. Katragadda. He went over your recent lab results. He will see you back in 6 months for labs and follow up.   Thank you for choosing [facility] at [facility] to provide your oncology and hematology care.  To afford each patient quality time with our provider, please arrive at least 15 minutes before your scheduled appointment time.   If you have a lab appointment with the Cancer Center please come in thru the  Main Entrance and check in at the main information desk  You need to re-schedule your appointment should you arrive 10 or more minutes late.  We strive to give you quality time with our providers, and arriving late affects you and other patients whose appointments are after yours.  Also, if you no show three or more times for appointments you may be dismissed from the clinic at the providers discretion.     Again, thank you for choosing [facility].  Our hope is that these requests will decrease the amount of time that you wait before being seen by our physicians.       _____________________________________________________________  Should you have questions after your visit to [facility], please contact our office at [phone] between the hours of 8:00 a.m. and 4:30 p.m.  Voicemails left after 4:00 p.m. will not be returned until the following business day.  For prescription refill requests, have your pharmacy contact our office and allow 72 hours.    Cancer Center Support Programs:   > Cancer Support Group  2nd Tuesday of the month 1pm-2pm, Journey Room    

## 2019-02-02 NOTE — Progress Notes (Signed)
[address] [place], [place] [address]   CLINIC:  Medical Oncology/Hematology  PCP:  Mikey Kirschner, [place] [address] [phone]   REASON FOR VISIT:  Follow-up for lymphocytic leukocytosis.  CURRENT THERAPY: Observation.   INTERVAL HISTORY:  Brandon Vega 68 y.o. male seen for follow-up of lymphocytic leukocytosis.  Denies any fevers or night sweats.  About 10 pound weight loss since March of this year, attributed to lifting weights at the gym.  No recurrent infections in the last 1 year.  Continues to be active and runs on a regular basis and goes to gym regularly.  Denies any bleeding per rectum or melena.  Denies any nausea, vomiting or diarrhea or constipation.  No new onset pains.  Occasional lightheadedness when he stands up.  Numbness in the bottom of the left foot has been stable.  Appetite is 100%.  Energy levels are 75%.    REVIEW OF SYSTEMS:  Review of Systems  Neurological: Positive for dizziness and numbness.  All other systems reviewed and are negative.    PAST MEDICAL/SURGICAL HISTORY:  Past Medical History:  Diagnosis Date  . Allergy   . ED (erectile dysfunction)    Past Surgical History:  Procedure Laterality Date  . COLONOSCOPY N/A 12/26/2017   Procedure: COLONOSCOPY;  Surgeon: Rogene Houston, MD;  Location: [facility];  Service: Endoscopy;  Laterality: N/A;  930  . VASECTOMY       SOCIAL HISTORY:  Social History   Socioeconomic History  . Marital status: Married    Spouse name: Not on file  . Number of children: Not on file  . Years of education: Not on file  . Highest education level: Not on file  Occupational History  . Not on file  Social Needs  . Financial resource strain: Not on file  . Food insecurity    Worry: Not on file    Inability: Not on file  . Transportation needs    Medical: Not on file    Non-medical: Not on file  Tobacco Use  . Smoking status: Current  Every Day Smoker    Types: Pipe  . Smokeless tobacco: Never Used  Substance and Sexual Activity  . Alcohol use: Yes    Alcohol/week: 1.0 standard drinks    Types: 1 Glasses of wine per week  . Drug use: Never  . Sexual activity: Not on file  Lifestyle  . Physical activity    Days per week: Not on file    Minutes per session: Not on file  . Stress: Not on file  Relationships  . Social Herbalist on phone: Not on file    Gets together: Not on file    Attends religious service: Not on file    Active member of club or organization: Not on file    Attends meetings of clubs or organizations: Not on file    Relationship status: Not on file  . Intimate partner violence    Fear of current or ex partner: Not on file    Emotionally abused: Not on file    Physically abused: Not on file    Forced sexual activity: Not on file  Other Topics Concern  . Not on file  Social History Narrative  . Not on file    FAMILY HISTORY:  Family History  Problem Relation Age of Onset  . Cancer Mother  throat  . Hypertension Father   . Diabetes Father   . Hyperlipidemia Father   . Cancer Father        lung  . Cancer Other        breast  . Heart attack Other     CURRENT MEDICATIONS:  Outpatient Encounter Medications as of 02/02/2019  Medication Sig  . saw palmetto 500 MG capsule Take 500 mg by mouth daily.  [place] Kitchen amLODipine (NORVASC) 5 MG tablet Take one tablet at bedtime  . aspirin EC 81 MG tablet Take 81 mg by mouth daily.  . Misc Natural Products ([place]) Prostate Health  . Multiple Vitamins-Minerals (CENTRUM SILVER PO) Take 1 tablet by mouth daily.   No facility-administered encounter medications on file as of 02/02/2019.     ALLERGIES:  Allergies  Allergen Reactions  . Ace Inhibitors     cough     PHYSICAL EXAM:  ECOG Performance status: 1  Vitals:   02/02/19 1151  BP: 117/67  Pulse: (!) 51  Resp: 16  Temp: 97.7 F (36.5 C)  SpO2: 99%   Filed  Weights   02/02/19 1150 02/02/19 1151  Weight: 182 lb 6.4 oz (82.7 kg) 182 lb 6.4 oz (82.7 kg)    Physical Exam Vitals signs reviewed.  Constitutional:      Appearance: Normal appearance.  Cardiovascular:     Rate and Rhythm: Normal rate and regular rhythm.     Heart sounds: Normal heart sounds.  Pulmonary:     Effort: Pulmonary effort is normal.     Breath sounds: Normal breath sounds.  Abdominal:     General: There is no distension.     Palpations: Abdomen is soft. There is no mass.  Musculoskeletal:        General: No swelling.  Lymphadenopathy:     Cervical: No cervical adenopathy.  Skin:    General: Skin is warm.  Neurological:     General: No focal deficit present.     Mental Status: He is alert and oriented to person, place, and time.  Psychiatric:        Mood and Affect: Mood normal.        Behavior: Behavior normal.      LABORATORY DATA:  I have reviewed the labs as listed.  CBC    Component Value Date/Time   WBC 12.2 (H) 01/07/2019 1349   RBC 4.57 01/07/2019 1349   RBC 4.57 01/07/2019 1349   HGB 13.9 01/07/2019 1349   HGB 14.6 12/15/2018 0953   HCT 44.1 01/07/2019 1349   HCT 43.3 12/15/2018 0953   PLT 280 01/07/2019 1349   PLT 275 12/15/2018 0953   MCV 96.5 01/07/2019 1349   MCV 91 12/15/2018 0953   MCH 30.4 01/07/2019 1349   MCHC 31.5 01/07/2019 1349   RDW 12.9 01/07/2019 1349   RDW 11.7 12/15/2018 0953   LYMPHSABS 5.8 (H) 01/07/2019 1349   LYMPHSABS 6.1 (H) 12/15/2018 0953   MONOABS 0.8 01/07/2019 1349   EOSABS 0.3 01/07/2019 1349   EOSABS 0.3 12/15/2018 0953   BASOSABS 0.1 01/07/2019 1349   BASOSABS 0.1 12/15/2018 0953   CMP Latest Ref Rng & Units 12/15/2018 08/07/2017 09/10/2014  Glucose 65 - 99 mg/dL 98 91 77  BUN 8 - 27 mg/dL 12 12 15   Creatinine 0.76 - 1.27 mg/dL 0.87 1.00 0.96  Sodium 134 - 144 mmol/L 139 142 141  Potassium 3.5 - 5.2 mmol/L 4.3 4.9 4.7  Chloride 96 - 106 mmol/L 102  104 101  CO2 20 - 29 mmol/L 22 22 26   Calcium 8.6 -  10.2 mg/dL 9.6 9.4 9.9  Total Protein 6.0 - 8.5 g/dL 7.5 6.6 6.8  Total Bilirubin 0.0 - 1.2 mg/dL 1.2 1.5(H) 1.5(H)  Alkaline Phos 39 - 117 IU/L 87 59 60  AST 0 - 40 IU/L 23 28 25   ALT 0 - 44 IU/L 17 23 22        DIAGNOSTIC IMAGING:  I have independently reviewed the scans and discussed with the patient.     ASSESSMENT & PLAN:   CLL (chronic lymphocytic leukemia) (HCC) 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 -Denies any fevers or night sweats.  Lost 10 pounds since March and attributes to lifting weights at gym. -No palpable adenopathy or splenomegaly on physical exam.  No recurrent infections. -Denies any exposure to chemicals/pesticides.  Physically very active, runs every day and exercise on regular basis. -We reviewed results of the peripheral blood flow cytometry from 01/08/2019 which showed CD5 positive monoclonal B-cell population, consistent with CLL. - I talked about normal pathophysiology of CLL.  Treatment is indicated only if cytopenias, B symptoms, recurrent infections or painful lymphadenopathy. -We can continue to monitor at six-month intervals with physical exam and labs.  2.  Health maintenance: -Colonoscopy on 12/26/2017 shows diverticulosis of the sigmoid colon and external hemorrhoids.  3.  Smoking history: - Does not smoke cigarettes.  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   Total time spent is 25 minutes with more than 50% of the time spent face-to-face discussing new diagnosis, treatment indications, counseling and coordination of care.  Orders placed this encounter:  No orders of the defined types were placed in this encounter.     Derek Jack, MD [place] [phone]

## 2019-02-03 ENCOUNTER — Other Ambulatory Visit: Payer: Self-pay | Admitting: Family Medicine

## 2019-06-08 ENCOUNTER — Telehealth: Payer: Self-pay | Admitting: Family Medicine

## 2019-06-08 DIAGNOSIS — L609 Nail disorder, unspecified: Secondary | ICD-10-CM

## 2019-06-08 NOTE — Telephone Encounter (Signed)
Please advise. Thank you

## 2019-06-08 NOTE — Telephone Encounter (Signed)
Referral placed and pt is aware. 

## 2019-06-08 NOTE — Telephone Encounter (Signed)
Patient would like a referral to a dermatologist not in [place]. He states he went here but they didn't help him with finger nail and its still yellow and he wants a good dermatologist.

## 2019-06-08 NOTE — Telephone Encounter (Signed)
Lets do [facility]

## 2019-06-18 ENCOUNTER — Encounter: Payer: Self-pay | Admitting: Family Medicine

## 2019-06-24 ENCOUNTER — Encounter: Payer: Self-pay | Admitting: Family Medicine

## 2019-07-27 ENCOUNTER — Other Ambulatory Visit: Payer: Self-pay

## 2019-07-27 ENCOUNTER — Inpatient Hospital Stay (HOSPITAL_COMMUNITY): Payer: 59 | Attending: Hematology

## 2019-07-27 DIAGNOSIS — C9110 Chronic lymphocytic leukemia of B-cell type not having achieved remission: Secondary | ICD-10-CM | POA: Insufficient documentation

## 2019-07-27 DIAGNOSIS — B351 Tinea unguium: Secondary | ICD-10-CM | POA: Insufficient documentation

## 2019-07-27 LAB — COMPREHENSIVE METABOLIC PANEL
ALT: 19 U/L (ref 0–44)
AST: 24 U/L (ref 15–41)
Albumin: 4 g/dL (ref 3.5–5.0)
Alkaline Phosphatase: 59 U/L (ref 38–126)
Anion gap: 9 (ref 5–15)
BUN: 18 mg/dL (ref 8–23)
CO2: 28 mmol/L (ref 22–32)
Calcium: 9.6 mg/dL (ref 8.9–10.3)
Chloride: 102 mmol/L (ref 98–111)
Creatinine, Ser: 0.93 mg/dL (ref 0.61–1.24)
GFR calc Af Amer: >60 mL/min (ref >60)
GFR calc non Af Amer: >60 mL/min (ref >60)
Glucose, Bld: 94 mg/dL (ref 70–99)
Potassium: 4.2 mmol/L (ref 3.5–5.1)
Sodium: 139 mmol/L (ref 135–145)
Total Bilirubin: 1.4 mg/dL — ABNORMAL HIGH (ref 0.3–1.2)
Total Protein: 7.6 g/dL (ref 6.5–8.1)

## 2019-07-27 LAB — CBC WITH DIFFERENTIAL/PLATELET
Abs Immature Granulocytes: 0.04 10*3/uL (ref 0.00–0.07)
Basophils Absolute: 0.1 10*3/uL (ref 0.0–0.1)
Basophils Relative: 0 %
Eosinophils Absolute: 0.3 10*3/uL (ref 0.0–0.5)
Eosinophils Relative: 2 %
HCT: 46.2 % (ref 39.0–52.0)
Hemoglobin: 14.6 g/dL (ref 13.0–17.0)
Immature Granulocytes: 0 %
Lymphocytes Relative: 43 %
Lymphs Abs: 5 10*3/uL — ABNORMAL HIGH (ref 0.7–4.0)
MCH: 31.1 pg (ref 26.0–34.0)
MCHC: 31.6 g/dL (ref 30.0–36.0)
MCV: 98.5 fL (ref 80.0–100.0)
Monocytes Absolute: 0.7 10*3/uL (ref 0.1–1.0)
Monocytes Relative: 6 %
Neutro Abs: 5.7 10*3/uL (ref 1.7–7.7)
Neutrophils Relative %: 49 %
Platelets: 235 10*3/uL (ref 150–400)
RBC: 4.69 MIL/uL (ref 4.22–5.81)
RDW: 13 % (ref 11.5–15.5)
WBC: 11.7 10*3/uL — ABNORMAL HIGH (ref 4.0–10.5)
nRBC: 0 % (ref 0.0–0.2)

## 2019-07-27 LAB — LACTATE DEHYDROGENASE: LDH: 125 U/L (ref 98–192)

## 2019-08-03 ENCOUNTER — Other Ambulatory Visit: Payer: Self-pay

## 2019-08-03 ENCOUNTER — Inpatient Hospital Stay (HOSPITAL_BASED_OUTPATIENT_CLINIC_OR_DEPARTMENT_OTHER): Payer: 59 | Admitting: Hematology

## 2019-08-03 ENCOUNTER — Encounter (HOSPITAL_COMMUNITY): Payer: Self-pay | Admitting: Hematology

## 2019-08-03 VITALS — BP 114/71 | HR 62 | Temp 97.8°F | Resp 18 | Wt 190.0 lb

## 2019-08-03 DIAGNOSIS — C9110 Chronic lymphocytic leukemia of B-cell type not having achieved remission: Secondary | ICD-10-CM

## 2019-08-03 NOTE — Assessment & Plan Note (Signed)
1.  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 -Denies any fevers, night sweats or weight loss.  Denies any recurrent infections or hospitalizations. -Peripheral blood flow cytometry from 01/08/2019 showed CD5 positive monoclonal B-cell population, consistent with CLL. -We reviewed his labs today.  White count is 11.7 with increased absolute lymphocyte count.  Hemoglobin and platelets are normal.  LDH was normal. -LFTs were normal except slightly elevated total bilirubin of 1.4.  He is reportedly taking antifungal treatment for toenail fungus as prescribed by his dermatologist. 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  He already received COVID-19 vaccine. -I plan to see him back in 6 months for follow-up with labs.  2.  Health maintenance: -Colonoscopy on 12/26/2016 shows diverticulosis of the sigmoid colon external hemorrhoids.  3.  Smoking history: -Does not smoke cigarettes.  However smokes pipe every day for past 30+ years.

## 2019-08-03 NOTE — Patient Instructions (Addendum)
[facility] at [facility] Discharge Instructions  You were seen today by Dr. Katragadda. He went over your recent lab results. He will see you back in 6 months for labs and follow up.   Thank you for choosing [facility] at [facility] to provide your oncology and hematology care.  To afford each patient quality time with our provider, please arrive at least 15 minutes before your scheduled appointment time.   If you have a lab appointment with the Cancer Center please come in thru the  Main Entrance and check in at the main information desk  You need to re-schedule your appointment should you arrive 10 or more minutes late.  We strive to give you quality time with our providers, and arriving late affects you and other patients whose appointments are after yours.  Also, if you no show three or more times for appointments you may be dismissed from the clinic at the providers discretion.     Again, thank you for choosing [facility].  Our hope is that these requests will decrease the amount of time that you wait before being seen by our physicians.       _____________________________________________________________  Should you have questions after your visit to [facility], please contact our office at [phone] between the hours of 8:00 a.m. and 4:30 p.m.  Voicemails left after 4:00 p.m. will not be returned until the following business day.  For prescription refill requests, have your pharmacy contact our office and allow 72 hours.    Cancer Center Support Programs:   > Cancer Support Group  2nd Tuesday of the month 1pm-2pm, Journey Room    

## 2019-08-03 NOTE — Progress Notes (Signed)
[place], [place] [address]   CLINIC:  Medical Oncology/Hematology  PCP:  Mikey Kirschner, [place] [address] [phone]   REASON FOR VISIT:  Follow-up for lymphocytic leukocytosis.  CURRENT THERAPY: Observation.   INTERVAL HISTORY:  Brandon Vega 69 y.o. male seen for follow-up of CLL.  Reports appetite and energy levels of 100%.  Occasional dizziness when Brandon Vega gets up fast.  Otherwise Brandon Vega has numbness in the bottom of the feet which is also chronic and stable.  Denies any recent infections or hospitalizations.  No trips to the ER was reported.    REVIEW OF SYSTEMS:  Review of Systems  Neurological: Positive for dizziness and numbness.  All other systems reviewed and are negative.    PAST MEDICAL/SURGICAL HISTORY:  Past Medical History:  Diagnosis Date  . Allergy   . ED (erectile dysfunction)    Past Surgical History:  Procedure Laterality Date  . COLONOSCOPY N/A 12/26/2017   Procedure: COLONOSCOPY;  Surgeon: Rogene Houston, MD;  Location: [facility];  Service: Endoscopy;  Laterality: N/A;  930  . VASECTOMY       SOCIAL HISTORY:  Social History   Socioeconomic History  . Marital status: Married    Spouse name: Not on file  . Number of children: Not on file  . Years of education: Not on file  . Highest education level: Not on file  Occupational History  . Not on file  Tobacco Use  . Smoking status: Current Every Day Smoker    Types: Pipe  . Smokeless tobacco: Never Used  Substance and Sexual Activity  . Alcohol use: Yes    Alcohol/week: 1.0 standard drinks    Types: 1 Glasses of wine per week  . Drug use: Never  . Sexual activity: Not on file  Other Topics Concern  . Not on file  Social History Narrative  . Not on file   Social Determinants of Health   Financial Resource Strain:   . Difficulty of Paying Living Expenses:   Food Insecurity:   . Worried About Sales executive in the Last Year:   . Arboriculturist in the Last Year:   Transportation Needs:   . Film/video editor (Medical):   Marland Kitchen Lack of Transportation (Non-Medical):   Physical Activity:   . Days of Exercise per Week:   . Minutes of Exercise per Session:   Stress:   . Feeling of Stress :   Social Connections:   . Frequency of Communication with Friends and Family:   . Frequency of Social Gatherings with Friends and Family:   . Attends Religious Services:   . Active Member of Clubs or Organizations:   . Attends Archivist Meetings:   Marland Kitchen Marital Status:   Intimate Partner Violence:   . Fear of Current or Ex-Partner:   . Emotionally Abused:   Marland Kitchen Physically Abused:   . Sexually Abused:     FAMILY HISTORY:  Family History  Problem Relation Age of Onset  . Cancer Mother        throat  . Hypertension Father   . Diabetes Father   . Hyperlipidemia Father   . Cancer Father        lung  . Cancer Other        breast  . Heart attack Other     CURRENT MEDICATIONS:  Outpatient Encounter Medications as of 08/03/2019  Medication Sig  . amLODipine (NORVASC) 5 MG tablet TAKE 1 TABLET BY MOUTH AT BEDTIME  . aspirin EC 81 MG tablet Take 81 mg by mouth daily.  . Misc Natural Products ([place]) Prostate Health  . Multiple Vitamins-Minerals (CENTRUM SILVER PO) Take 1 tablet by mouth daily.  Marland Kitchen terbinafine (LAMISIL) 250 MG tablet Take 250 mg by mouth daily.  . [DISCONTINUED] saw palmetto 500 MG capsule Take 500 mg by mouth daily.   No facility-administered encounter medications on file as of 08/03/2019.    ALLERGIES:  Allergies  Allergen Reactions  . Ace Inhibitors     cough     PHYSICAL EXAM:  ECOG Performance status: 1  Vitals:   08/03/19 1109  BP: 114/71  Pulse: 62  Resp: 18  Temp: 97.8 F (36.6 C)  SpO2: 99%   Filed Weights   08/03/19 1109  Weight: 190 lb (86.2 kg)    Physical Exam Vitals reviewed.  Constitutional:      Appearance: Normal  appearance.  Cardiovascular:     Rate and Rhythm: Normal rate and regular rhythm.     Heart sounds: Normal heart sounds.  Pulmonary:     Effort: Pulmonary effort is normal.     Breath sounds: Normal breath sounds.  Abdominal:     General: There is no distension.     Palpations: Abdomen is soft. There is no mass.  Musculoskeletal:        General: No swelling.  Lymphadenopathy:     Cervical: No cervical adenopathy.  Skin:    General: Skin is warm.  Neurological:     General: No focal deficit present.     Mental Status: Brandon Vega is alert and oriented to person, place, and time.  Psychiatric:        Mood and Affect: Mood normal.        Behavior: Behavior normal.      LABORATORY DATA:  I have reviewed the labs as listed.  CBC    Component Value Date/Time   WBC 11.7 (H) 07/27/2019 1036   RBC 4.69 07/27/2019 1036   HGB 14.6 07/27/2019 1036   HGB 14.6 12/15/2018 0953   HCT 46.2 07/27/2019 1036   HCT 43.3 12/15/2018 0953   PLT 235 07/27/2019 1036   PLT 275 12/15/2018 0953   MCV 98.5 07/27/2019 1036   MCV 91 12/15/2018 0953   MCH 31.1 07/27/2019 1036   MCHC 31.6 07/27/2019 1036   RDW 13.0 07/27/2019 1036   RDW 11.7 12/15/2018 0953   LYMPHSABS 5.0 (H) 07/27/2019 1036   LYMPHSABS 6.1 (H) 12/15/2018 0953   MONOABS 0.7 07/27/2019 1036   EOSABS 0.3 07/27/2019 1036   EOSABS 0.3 12/15/2018 0953   BASOSABS 0.1 07/27/2019 1036   BASOSABS 0.1 12/15/2018 0953   CMP Latest Ref Rng & Units 07/27/2019 12/15/2018 08/07/2017  Glucose 70 - 99 mg/dL 94 98 91  BUN 8 - 23 mg/dL 18 12 12   Creatinine 0.61 - 1.24 mg/dL 0.93 0.87 1.00  Sodium 135 - 145 mmol/L 139 139 142  Potassium 3.5 - 5.1 mmol/L 4.2 4.3 4.9  Chloride 98 - 111 mmol/L 102 102 104  CO2 22 - 32 mmol/L 28 22 22   Calcium 8.9 - 10.3 mg/dL 9.6 9.6 9.4  Total Protein 6.5 - 8.1 g/dL 7.6 7.5 6.6  Total Bilirubin 0.3 - 1.2 mg/dL 1.4(H) 1.2 1.5(H)  Alkaline Phos 38 - 126 U/L 59 87 59  AST 15 - 41 U/L 24 23 28   ALT 0 -  44 U/L 19 17 23         DIAGNOSTIC IMAGING:  I have reviewed the scans.     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 -Denies any fevers, night sweats or weight loss.  Denies any recurrent infections or hospitalizations. -Peripheral blood flow cytometry from 01/08/2019 showed CD5 positive monoclonal B-cell population, consistent with CLL. -We reviewed his labs today.  White count is 11.7 with increased absolute lymphocyte count.  Hemoglobin and platelets are normal.  LDH was normal. -LFTs were normal except slightly elevated total bilirubin of 1.4.  Brandon Vega is reportedly taking antifungal treatment for toenail fungus as prescribed by his dermatologist. 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  Brandon Vega already received COVID-19 vaccine. -I plan to see him back in 6 months for follow-up with labs.  2.  Health maintenance: -Colonoscopy on 12/26/2016 shows diverticulosis of the sigmoid colon external hemorrhoids.  3.  Smoking history: -Does not smoke cigarettes.  However smokes pipe every day for past 30+ years.   Orders placed this encounter:  Orders Placed This Encounter  Procedures  . CBC with Differential/Platelet  . Comprehensive metabolic panel  . Lactate dehydrogenase      Derek Jack, MD [place] [phone]

## 2019-08-09 ENCOUNTER — Other Ambulatory Visit: Payer: Self-pay | Admitting: Family Medicine

## 2019-09-28 ENCOUNTER — Telehealth: Payer: Self-pay | Admitting: Family Medicine

## 2019-09-28 MED ORDER — AMLODIPINE BESYLATE 5 MG PO TABS: 5.0000 mg | ORAL_TABLET | Freq: Every day | ORAL | 0 refills | Status: DC

## 2019-09-28 NOTE — Telephone Encounter (Signed)
Patient calling needs refill on medications

## 2019-09-28 NOTE — Telephone Encounter (Signed)
Refill of amlodipine sent in to pharmacy and pt is aware

## 2019-09-28 NOTE — Telephone Encounter (Signed)
Ok times one 

## 2019-09-28 NOTE — Telephone Encounter (Signed)
Pt scheduled for 09/30/19. Pt needing refill on Amlodipine. Please advise. Thank you

## 2019-09-30 ENCOUNTER — Encounter: Payer: Self-pay | Admitting: Family Medicine

## 2019-09-30 ENCOUNTER — Telehealth: Payer: Self-pay | Admitting: *Deleted

## 2019-09-30 ENCOUNTER — Other Ambulatory Visit: Payer: Self-pay

## 2019-09-30 ENCOUNTER — Ambulatory Visit (INDEPENDENT_AMBULATORY_CARE_PROVIDER_SITE_OTHER): Payer: 59 | Admitting: Family Medicine

## 2019-09-30 VITALS — BP 118/76 | Temp 98.1°F | Ht 69.0 in | Wt 192.8 lb

## 2019-09-30 DIAGNOSIS — I10 Essential (primary) hypertension: Secondary | ICD-10-CM | POA: Diagnosis not present

## 2019-09-30 MED ORDER — MEFLOQUINE HCL 250 MG PO TABS: ORAL_TABLET | ORAL | 0 refills | Status: DC

## 2019-09-30 MED ORDER — AMLODIPINE BESYLATE 5 MG PO TABS: 5.0000 mg | ORAL_TABLET | Freq: Every day | ORAL | 1 refills | Status: DC

## 2019-09-30 NOTE — Progress Notes (Signed)
   Subjective:    Patient ID: Brandon Vega, male    DOB: 01-13-1951, 69 y.o.   MRN: [id number]  HPIpt is going to [place] on June 16th and would like to get rx for malaria pills.   Pt states no other concerns today.   Blood pressure medicine and blood pressure levels reviewed today with patient. Compliant with blood pressure medicine. States does not miss a dose. No obvious side effects. Blood pressure generally good when checked elsewhere. Watching salt intake.    Review of Systems No headache, no major weight loss or weight gain, no chest pain no back pain abdominal pain no change in bowel habits complete ROS otherwise negative     Objective:   Physical Exam  Alert vitals stable, NAD. Blood pressure good on repeat. HEENT normal. Lungs clear. Heart regular rate and rhythm.       Assessment & Plan:  Impression 1 hypertension good control discussed maintain same meds  2.  Malaria prophylaxis.  Patient took mefloquine in the past and it helps.  Will maintain same prescription  Follow-up in 6 months diet exercise discussed medications refilled

## 2019-09-30 NOTE — Telephone Encounter (Signed)
Medication sent to [facility] on [address] Abbott Pao is aware.

## 2019-09-30 NOTE — Telephone Encounter (Signed)
Need to call [facility] and find out what pt was prescribed in 2018 for malaria prevention. Pt was in this morning and he wants med sent to [facility] [address] Pt would like a call back on [phone].

## 2019-09-30 NOTE — Telephone Encounter (Signed)
Ok prescribe 8 tablets with same instruction

## 2019-09-30 NOTE — Telephone Encounter (Signed)
Called [facility] and in 2018 pt got mefloquine 250mg . Take one weekly starting one week before travel and 4 weeks after returning

## 2019-10-30 ENCOUNTER — Telehealth: Payer: Self-pay | Admitting: Family Medicine

## 2019-10-30 NOTE — Telephone Encounter (Signed)
Hydration and rest.  Could be side effect of malaria pills.   Go to urgent care this weekend if worsneing.   Can see in office on Monday if not improving.  Dr. Darene Lamer

## 2019-10-30 NOTE — Telephone Encounter (Signed)
Pt contacted and verbalized understanding.  

## 2019-10-30 NOTE — Telephone Encounter (Signed)
Pt calling into office. Pt states that on Wednesday his BP became low. Thursday become really cold when he went to bed. His BP was 98/41 on Wednesday. Today his BP is 119/61 and is feeling fine. Pt has been experiencing bouts of dizziness. Pt was on his way to [place] but bus stopped in [place] and then the trip was cancelled. Pt states it was a long trip and he believes he was dehydrated. Pt has leukemia and CLL. Pt states the only thing different is he took [place] pills before leaving for trip. Pt is on Amlodipine 5 mg daily. Please advise. Thank you

## 2019-11-26 ENCOUNTER — Other Ambulatory Visit: Payer: Self-pay

## 2019-11-26 ENCOUNTER — Encounter: Payer: Self-pay | Admitting: Family Medicine

## 2019-11-26 ENCOUNTER — Ambulatory Visit (INDEPENDENT_AMBULATORY_CARE_PROVIDER_SITE_OTHER): Payer: Medicare Other | Admitting: Family Medicine

## 2019-11-26 VITALS — BP 118/72 | HR 53 | Temp 97.2°F | Ht 69.0 in | Wt 192.0 lb

## 2019-11-26 DIAGNOSIS — M25511 Pain in right shoulder: Secondary | ICD-10-CM

## 2019-11-26 DIAGNOSIS — S29012A Strain of muscle and tendon of back wall of thorax, initial encounter: Secondary | ICD-10-CM

## 2019-11-26 MED ORDER — HYDROCODONE-ACETAMINOPHEN 5-325 MG PO TABS: ORAL_TABLET | ORAL | 0 refills | Status: DC

## 2019-11-26 MED ORDER — DICLOFENAC SODIUM 75 MG PO TBEC: 75.0000 mg | DELAYED_RELEASE_TABLET | Freq: Two times a day (BID) | ORAL | 0 refills | Status: DC

## 2019-11-26 NOTE — Progress Notes (Signed)
   Patient ID: Brandon Vega, male    DOB: 02-03-1951, 69 y.o.   MRN: [id number]   Chief Complaint  Patient presents with  . Shoulder Pain   Subjective:    HPI right shoulder pain for 3 weeks. Started hurting the day after going fishing and was doing a lot of casting off the peer.  Rt shoulder pain, near rt shoulder blade.   Fishing and casting the day prior.  meds- advil, 4 ibuporfen at a time. Using icy hot.  No surgery on the rt shoulder.  Working out in gym 3-4 days per week. No weights this week.   Medical History Dayle has a past medical history of Allergy and ED (erectile dysfunction).   Outpatient Encounter Medications as of 11/26/2019  Medication Sig  . amLODipine (NORVASC) 5 MG tablet Take 1 tablet (5 mg total) by mouth at bedtime.  Marland Kitchen aspirin EC 81 MG tablet Take 81 mg by mouth daily.  . Misc Natural Products ([place]) Prostate Health  . Multiple Vitamins-Minerals (CENTRUM SILVER PO) Take 1 tablet by mouth daily.  . diclofenac (VOLTAREN) 75 MG EC tablet Take 1 tablet (75 mg total) by mouth 2 (two) times daily. With food.  Marland Kitchen HYDROcodone-acetaminophen (NORCO) 5-325 MG tablet Take 1/2 -1 tab p.o. q6hr prn pain.  . [DISCONTINUED] mefloquine (LARIAM) 250 MG tablet Take one tablet po weekly starting one week before travel and 4 weeks after returning.   No facility-administered encounter medications on file as of 11/26/2019.     Review of Systems  Constitutional: Negative for chills and fever.  HENT: Negative for congestion, rhinorrhea and sore throat.   Respiratory: Negative for cough, shortness of breath and wheezing.   Cardiovascular: Negative for chest pain and leg swelling.  Gastrointestinal: Negative for abdominal pain, diarrhea, nausea and vomiting.  Genitourinary: Negative for dysuria and frequency.  Musculoskeletal:       +rt shoulder pain.  Skin: Negative for rash.  Neurological: Negative for dizziness, weakness and headaches.     Vitals BP  118/72   Pulse (!) 53   Temp (!) 97.2 F (36.2 C)   Ht 5\' 9"  (1.753 m)   Wt 192 lb (87.1 kg)   SpO2 97%   BMI 28.35 kg/m   Objective:   Physical Exam Musculoskeletal:        General: Tenderness present. No swelling.     Comments: +rt shoulder pain, ttp in rt rhomboid and rt trapezius. Dec rom with abduction.  Skin:    General: Skin is warm and dry.     Findings: No rash.  Neurological:     General: No focal deficit present.     Mental Status: He is oriented to person, place, and time.      Assessment and Plan   1. Muscle strain of right upper back, initial encounter - diclofenac (VOLTAREN) 75 MG EC tablet; Take 1 tablet (75 mg total) by mouth 2 (two) times daily. With food.  Dispense: 30 tablet; Refill: 0 - HYDROcodone-acetaminophen (NORCO) 5-325 MG tablet; Take 1/2 -1 tab p.o. q6hr prn pain.  Dispense: 15 tablet; Refill: 0   Continue with icy hot or otc creams. Use diclofenac for next 2 wks, can add norco prn q6hrs. Use heating pad 3x per day for 20-58mins. Gave handout on muscle strain. Avoid shoulder exercises until 2 weeks.  F/u 2-3 wks if not improving.

## 2019-11-26 NOTE — Patient Instructions (Signed)
Thoracic Strain °A thoracic strain, which is sometimes called a mid-back strain, is an injury to the muscles or tendons that attach to the upper part of your back behind your chest. This type of injury occurs when a muscle is overstretched or overloaded. °Thoracic strains can range from mild to severe. Mild strains may involve stretching a muscle or tendon without tearing it. These injuries may heal in 1-2 weeks. More severe strains involve tearing of muscle fibers or tendons. These will cause more pain and may take 6-8 weeks to heal. °What are the causes? °This condition may be caused by: °· Trauma, such as a fall or a hit to the body. °· Twisting or overstretching the back. This may result from doing activities that require a lot of energy, such as lifting heavy objects. °In some cases, the cause may not be known. °What increases the risk? °This injury is more common in: °· Athletes. °· People with obesity. °What are the signs or symptoms? °The main symptom of this condition is pain in the middle back, especially with movement. Other symptoms include: °· Stiffness or limited range of motion. °· Sudden muscle tightening (spasms). °How is this diagnosed? °This condition may be diagnosed based on: °· Your symptoms. °· Your medical history. °· A physical exam. °· Imaging tests, such as X-rays or an MRI. °How is this treated? °This condition may be treated with: °· Resting the injured area. °· Applying heat and cold to the injured area. °· Over-the-counter medicines for pain and inflammation, such as NSAIDs. °· Prescription pain medicine or muscle relaxants may be needed for a short time. °· Physical therapy. This will involve doing stretching and strengthening exercises. °Follow these instructions at home: °Managing pain, stiffness, and swelling ° °  ° °· If directed, put ice on the injured area. °? Put ice in a plastic bag. °? Place a towel between your skin and the bag. °? Leave the ice on for 20 minutes, 2-3 times  a day. °· If directed, apply heat to the affected area as often as told by your health care provider. Use the heat source that your health care provider recommends, such as a moist heat pack or a heating pad. °? Place a towel between your skin and the heat source. °? Leave the heat on for 20-30 minutes. °? Remove the heat if your skin turns bright red. This is especially important if you are unable to feel pain, heat, or cold. You may have a greater risk of getting burned. °Activity °· Rest and return to your normal activities as told by your health care provider. Ask your health care provider what activities are safe for you. °· Do exercises as told by your health care provider. °Medicines °· Take over-the-counter and prescription medicines only as told by your health care provider. °· Ask your health care provider if the medicine prescribed to you: °? Requires you to avoid driving or using heavy machinery. °? Can cause constipation. You may need to take these actions to prevent or treat constipation: °§ Drink enough fluid to keep your urine pale yellow. °§ Take over-the-counter or prescription medicines. °§ Eat foods that are high in fiber, such as beans, whole grains, and fresh fruits and vegetables. °§ Limit foods that are high in fat and processed sugars, such as fried or sweet foods. °Injury prevention °To prevent a future mid-back injury: °· Always warm up properly before physical activity or sports. °· Cool down and stretch after being active. °·   Use correct form when playing sports and lifting heavy objects. Bend your knees before you lift heavy objects. °· Use good posture when sitting and standing. °· Stay physically fit and maintain a healthy weight. °? Do at least 150 minutes of moderate-intensity exercise each week, such as brisk walking or water aerobics. °? Do strength exercises at least 2 times each week. ° °General instructions °· Do not use any products that contain nicotine or tobacco, such as  cigarettes, e-cigarettes, and chewing tobacco. If you need help quitting, ask your health care provider. °· Keep all follow-up visits as told by your health care provider. This is important. °Contact a health care provider if: °· Your pain is not helped by medicine. °· Your pain or stiffness is getting worse. °· You develop pain or stiffness in your neck or lower back. °Get help right away if you: °· Have shortness of breath. °· Have chest pain. °· Develop numbness or weakness in your legs or arms. °· Have involuntary loss of urine (urinary incontinence). °Summary °· A thoracic strain, which is sometimes called a mid-back strain, is an injury to the muscles or tendons that attach to the upper part of your back behind your chest. °· This type of injury occurs when a muscle is overstretched or overloaded. °· Rest and return to your normal activities as told by your health care provider. If directed, apply heat or ice to the affected area as often as told by your health care provider. °· Take over-the-counter and prescription medicines only as told by your health care provider. °· Contact a health care provider if you have new or worsening symptoms. °This information is not intended to replace advice given to you by your health care provider. Make sure you discuss any questions you have with your health care provider. °Document Revised: 03/18/2018 Document Reviewed: 03/18/2018 °Elsevier Patient Education © 2020 [employer] ° °

## 2019-12-06 ENCOUNTER — Encounter: Payer: Self-pay | Admitting: Family Medicine

## 2019-12-07 ENCOUNTER — Other Ambulatory Visit: Payer: Self-pay

## 2019-12-07 ENCOUNTER — Encounter: Payer: Self-pay | Admitting: Family Medicine

## 2019-12-07 ENCOUNTER — Ambulatory Visit (INDEPENDENT_AMBULATORY_CARE_PROVIDER_SITE_OTHER): Payer: Medicare Other | Admitting: Family Medicine

## 2019-12-07 VITALS — BP 139/82 | HR 56 | Temp 97.9°F | Ht 69.0 in | Wt 191.6 lb

## 2019-12-07 DIAGNOSIS — M62838 Other muscle spasm: Secondary | ICD-10-CM | POA: Diagnosis not present

## 2019-12-07 MED ORDER — PREDNISONE 10 MG PO TABS: ORAL_TABLET | ORAL | 0 refills | Status: DC

## 2019-12-07 MED ORDER — TIZANIDINE HCL 2 MG PO CAPS: 2.0000 mg | ORAL_CAPSULE | Freq: Three times a day (TID) | ORAL | 0 refills | Status: DC | PRN

## 2019-12-07 MED ORDER — DICLOFENAC SODIUM 75 MG PO TBEC: 75.0000 mg | DELAYED_RELEASE_TABLET | Freq: Two times a day (BID) | ORAL | 0 refills | Status: DC

## 2019-12-07 NOTE — Progress Notes (Signed)
Patient ID: Brandon Vega, male    DOB: 1950-08-09, 69 y.o.   MRN: [id number]   Chief Complaint  Patient presents with  . Shoulder Pain   Subjective:    HPI  Pt here today for right should pain. More in scapula area. Pt was seen 11/26/19 for muscle strain. Pt given Hydrocodone and Voltaren. Pt did complete Hydrocodone but states it did not help much. Pt stopped taking Diclofenac over the weekend. Went to see a chiropractor about one week after visit here. Chiropractor did x rays and showed them to pt this morning. Possible pinched nerve in neck. Pt is having weakness in right hand and arm. Pt states it is hard to write his name.  Went to [employer] [facility] [employer] [facility] and did xrays 3 days ago.  Reviewed the xray today with Chiropractor. Might have pinched nerve at bottom of neck. Scoliosis lumbar spine. Pain down arm, and feeling dec strength with hand.  Weakness in rt hand, feels it with gripping occasionally.  No numbness. Hard to write name with rt hand.   Medical History [place] has a past medical history of Allergy and ED (erectile dysfunction).   Outpatient Encounter Medications as of 12/07/2019  Medication Sig  . amLODipine (NORVASC) 5 MG tablet Take 1 tablet (5 mg total) by mouth at bedtime.  Marland Kitchen aspirin EC 81 MG tablet Take 81 mg by mouth daily.  . Misc Natural Products ([place]) Prostate Health  . Multiple Vitamins-Minerals (CENTRUM SILVER PO) Take 1 tablet by mouth daily.  . diclofenac (VOLTAREN) 75 MG EC tablet Take 1 tablet (75 mg total) by mouth 2 (two) times daily.  . predniSONE (DELTASONE) 10 MG tablet Take 4 tab p.o. x 2 days, then 3 tab x 2 day, then 2 tab x 2 days, then 1 tab x 2 day.  . tizanidine (ZANAFLEX) 2 MG capsule Take 1 capsule (2 mg total) by mouth 3 (three) times daily as needed for muscle spasms.  . [DISCONTINUED] diclofenac (VOLTAREN) 75 MG EC tablet Take 1 tablet (75 mg total) by mouth 2 (two) times daily. With food. (Patient not taking: Reported on  12/07/2019)  . [DISCONTINUED] HYDROcodone-acetaminophen (NORCO) 5-325 MG tablet Take 1/2 -1 tab p.o. q6hr prn pain.   No facility-administered encounter medications on file as of 12/07/2019.     Review of Systems  Constitutional: Negative for chills and fever.  HENT: Negative for congestion, rhinorrhea and sore throat.   Respiratory: Negative for cough, shortness of breath and wheezing.   Cardiovascular: Negative for chest pain and leg swelling.  Gastrointestinal: Negative for abdominal pain, diarrhea, nausea and vomiting.  Genitourinary: Negative for dysuria and frequency.  Musculoskeletal:       +rt scapular pain.   Skin: Negative for rash.  Neurological: Positive for weakness (rt hand). Negative for dizziness and headaches.     Vitals BP (!) 139/82   Pulse 56   Temp 97.9 F (36.6 C)   Ht 5\' 9"  (1.753 m)   Wt 191 lb 9.6 oz (86.9 kg)   BMI 28.29 kg/m   Objective:   Physical Exam Vitals and nursing note reviewed.  Constitutional:      General: He is not in acute distress.    Appearance: Normal appearance. He is not ill-appearing.  Musculoskeletal:        General: Tenderness (rt rhomboid/rt trapezius ttp) present. No swelling, deformity or signs of injury. Normal range of motion.     Cervical back: Normal range of motion and  neck supple. No tenderness.     Comments: No ttp over cervical or thoracic spinous process. Normal rom neck and Rt shoulder.  Muscle strength 5/5 UE bilaterally.  Normal grip strength and normal sensation  Skin:    General: Skin is warm and dry.     Findings: No rash.  Neurological:     General: No focal deficit present.     Mental Status: He is alert and oriented to person, place, and time.     Cranial Nerves: No cranial nerve deficit.     Sensory: No sensory deficit.     Motor: No weakness.  Psychiatric:        Mood and Affect: Mood normal.        Behavior: Behavior normal.     Assessment and Plan   1. Muscle spasm of shoulder region -  diclofenac (VOLTAREN) 75 MG EC tablet; Take 1 tablet (75 mg total) by mouth 2 (two) times daily.  Dispense: 30 tablet; Refill: 0 - predniSONE (DELTASONE) 10 MG tablet; Take 4 tab p.o. x 2 days, then 3 tab x 2 day, then 2 tab x 2 days, then 1 tab x 2 day.  Dispense: 20 tablet; Refill: 0 - tizanidine (ZANAFLEX) 2 MG capsule; Take 1 capsule (2 mg total) by mouth 3 (three) times daily as needed for muscle spasms.  Dispense: 15 capsule; Refill: 0   F/u prn, if not improving may need MRI or referral to ortho. Gave pt number for walk in clinic at [facility] if pain worsening.

## 2019-12-15 ENCOUNTER — Telehealth: Payer: Self-pay | Admitting: Family Medicine

## 2019-12-15 DIAGNOSIS — M62838 Other muscle spasm: Secondary | ICD-10-CM

## 2019-12-15 DIAGNOSIS — S29012A Strain of muscle and tendon of back wall of thorax, initial encounter: Secondary | ICD-10-CM

## 2019-12-15 NOTE — Telephone Encounter (Signed)
Pt is needing a referral as soon as possible to a therapist for pinched nerve in his neck. He is out of the medication Dr. Lovena Le had prescribed.

## 2019-12-15 NOTE — Telephone Encounter (Signed)
Urgent referral ordered in Epic. Telephone call- voice mail not set up.

## 2019-12-15 NOTE — Telephone Encounter (Signed)
Patient notified and stated he does not want a refill of medication because it has not really been helping

## 2019-12-15 NOTE — Telephone Encounter (Signed)
May give referral to [facility] with one of the back orthopedic specialist Urgent referral please due to pain May have refill on diclofenac if need be If worsening issues please let us know

## 2020-01-27 ENCOUNTER — Ambulatory Visit (HOSPITAL_COMMUNITY): Payer: Medicare Other | Attending: Orthopedic Surgery | Admitting: Physical Therapy

## 2020-01-27 ENCOUNTER — Other Ambulatory Visit: Payer: Self-pay

## 2020-01-27 ENCOUNTER — Encounter (HOSPITAL_COMMUNITY): Payer: Self-pay | Admitting: Physical Therapy

## 2020-01-27 ENCOUNTER — Inpatient Hospital Stay (HOSPITAL_COMMUNITY): Payer: Medicare Other | Attending: Hematology

## 2020-01-27 DIAGNOSIS — C9110 Chronic lymphocytic leukemia of B-cell type not having achieved remission: Secondary | ICD-10-CM | POA: Insufficient documentation

## 2020-01-27 DIAGNOSIS — F17210 Nicotine dependence, cigarettes, uncomplicated: Secondary | ICD-10-CM | POA: Diagnosis not present

## 2020-01-27 DIAGNOSIS — Z833 Family history of diabetes mellitus: Secondary | ICD-10-CM | POA: Insufficient documentation

## 2020-01-27 DIAGNOSIS — R5383 Other fatigue: Secondary | ICD-10-CM | POA: Diagnosis not present

## 2020-01-27 DIAGNOSIS — Z8249 Family history of ischemic heart disease and other diseases of the circulatory system: Secondary | ICD-10-CM | POA: Diagnosis not present

## 2020-01-27 DIAGNOSIS — Z79899 Other long term (current) drug therapy: Secondary | ICD-10-CM | POA: Diagnosis not present

## 2020-01-27 DIAGNOSIS — Z801 Family history of malignant neoplasm of trachea, bronchus and lung: Secondary | ICD-10-CM | POA: Diagnosis not present

## 2020-01-27 DIAGNOSIS — Z8349 Family history of other endocrine, nutritional and metabolic diseases: Secondary | ICD-10-CM | POA: Diagnosis not present

## 2020-01-27 DIAGNOSIS — M5412 Radiculopathy, cervical region: Secondary | ICD-10-CM | POA: Diagnosis present

## 2020-01-27 LAB — CBC WITH DIFFERENTIAL/PLATELET
Abs Immature Granulocytes: 0.04 10*3/uL (ref 0.00–0.07)
Basophils Absolute: 0.1 10*3/uL (ref 0.0–0.1)
Basophils Relative: 1 %
Eosinophils Absolute: 0.4 10*3/uL (ref 0.0–0.5)
Eosinophils Relative: 3 %
HCT: 47.3 % (ref 39.0–52.0)
Hemoglobin: 14.9 g/dL (ref 13.0–17.0)
Immature Granulocytes: 0 %
Lymphocytes Relative: 45 %
Lymphs Abs: 5.1 10*3/uL — ABNORMAL HIGH (ref 0.7–4.0)
MCH: 31 pg (ref 26.0–34.0)
MCHC: 31.5 g/dL (ref 30.0–36.0)
MCV: 98.3 fL (ref 80.0–100.0)
Monocytes Absolute: 0.9 10*3/uL (ref 0.1–1.0)
Monocytes Relative: 8 %
Neutro Abs: 4.8 10*3/uL (ref 1.7–7.7)
Neutrophils Relative %: 43 %
Platelets: 235 10*3/uL (ref 150–400)
RBC: 4.81 MIL/uL (ref 4.22–5.81)
RDW: 12.9 % (ref 11.5–15.5)
WBC: 11.2 10*3/uL — ABNORMAL HIGH (ref 4.0–10.5)
nRBC: 0 % (ref 0.0–0.2)

## 2020-01-27 LAB — COMPREHENSIVE METABOLIC PANEL
ALT: 21 U/L (ref 0–44)
AST: 27 U/L (ref 15–41)
Albumin: 4.1 g/dL (ref 3.5–5.0)
Alkaline Phosphatase: 54 U/L (ref 38–126)
Anion gap: 10 (ref 5–15)
BUN: 14 mg/dL (ref 8–23)
CO2: 26 mmol/L (ref 22–32)
Calcium: 9.3 mg/dL (ref 8.9–10.3)
Chloride: 102 mmol/L (ref 98–111)
Creatinine, Ser: 0.88 mg/dL (ref 0.61–1.24)
GFR calc Af Amer: >60 mL/min (ref >60)
GFR calc non Af Amer: >60 mL/min (ref >60)
Glucose, Bld: 92 mg/dL (ref 70–99)
Potassium: 4.2 mmol/L (ref 3.5–5.1)
Sodium: 138 mmol/L (ref 135–145)
Total Bilirubin: 1.9 mg/dL — ABNORMAL HIGH (ref 0.3–1.2)
Total Protein: 7.6 g/dL (ref 6.5–8.1)

## 2020-01-27 LAB — LACTATE DEHYDROGENASE: LDH: 135 U/L (ref 98–192)

## 2020-01-27 NOTE — Therapy (Signed)
[facility] [place], [place], [address] Phone: [phone]   Fax:  [phone]  Physical Therapy Evaluation  Patient Details  Name: Brandon Vega MRN: [id number] Date of Birth: 01-27-1951 Referring Provider (PT): Melina Schools   Encounter Date: 01/27/2020   PT End of Session - 01/27/20 0908    Visit Number 1    Number of Visits 1    Authorization Type Medicare    PT Start Time 0830    PT Stop Time 0905    PT Time Calculation (min) 35 min    Activity Tolerance Patient tolerated treatment well    Behavior During Therapy [facility] for tasks assessed/performed           Past Medical History:  Diagnosis Date  . Allergy   . ED (erectile dysfunction)     Past Surgical History:  Procedure Laterality Date  . COLONOSCOPY N/A 12/26/2017   Procedure: COLONOSCOPY;  Surgeon: Rogene Houston, MD;  [facility];  Service: Endoscopy;  Laterality: N/A;  930  . VASECTOMY      There were no vitals filed for this visit.    Subjective Assessment - 01/27/20 0831    Subjective Pt states that he was casting his fishing rod all day long at the end of June the next day he has such severe pain in his neck and going down his whole arm, he could not use his arm.   His major pain was in his shoulder pain and he could not sleep.  He went to his primary who gave him medication which did not help, went to the chiropractor which did not help and then to the orthopedic who gave him medication which helpped.  He states that his church prayed for him as well and he no longer has any pain with any activity.    Pertinent History back pain    How long can you sit comfortably? no problem    How long can you stand comfortably? no problem    How long can you walk comfortably? no problem    Patient Stated Goals HEP    Currently in Pain? No/denies              [facility] PT Assessment - 01/27/20 0001      Assessment   Medical Diagnosis cervical  radiculopathy    Referring Provider (PT) Melina Schools    Onset Date/Surgical Date 11/08/19    Prior Therapy chiropractor       Precautions   Precautions None      Balance Screen   Has the patient fallen in the past 6 months No    Has the patient had a decrease in activity level because of a fear of falling?  No    Is the patient reluctant to leave their home because of a fear of falling?  No      Prior Function   Level of Independence Independent      Cognition   Overall Cognitive Status Within Functional Limits for tasks assessed      Observation/Other Assessments   Focus on Therapeutic Outcomes (FOTO)  96      ROM / Strength   AROM / PROM / Strength AROM;Strength      AROM   AROM Assessment Site Cervical    Cervical Flexion 40    Cervical Extension 45    Cervical - Right Side Bend 25    Cervical - Left Side Bend 25  Cervical - Right Rotation 50    Cervical - Left Rotation 50      Strength   Overall Strength Within functional limits for tasks performed    Overall Strength Comments cervical and shoulder tested.                       Objective measurements completed on examination: See above findings.               PT Education - 01/27/20 0906    Education Details Educated in the importance of postrure for cervical health, the need for raising I-pad , computer screens and books if you are going to be on them for prolong period of time and cervical ROM exercises.    Person(s) Educated Patient    Methods Explanation;Demonstration;Verbal cues;Handout    Comprehension Verbalized understanding;Returned demonstration            PT Short Term Goals - 01/27/20 0911      PT SHORT TERM GOAL #1   Title PT to complete HEP to avoid further cervical pain    Time 1    Period Days    Status New    Target Date 01/29/20      PT SHORT TERM GOAL #2   Title PT to be aware of increased disc pressure when looking down for prolong periods of time to  allow pt to change habits to avoid this.    Time 1    Period Days    Status New                     Plan - 01/27/20 0908    Clinical Impression Statement Pt is a 69 yo male who had increased cervical radiculopathy after casting a fishing rod all day in June.  At this time all sx have resolved.  Evaluation demonstrates slight posture dysfunction as well as decreased cervical ROM.  Pt was educated in [place], and cervical ROM exercises. Both theapist and pt agreed that pt will most likely be fine with a HEP at this time as pt is back to all ADL without  sx.    Personal Factors and Comorbidities Comorbidity 1    Comorbidities back pain    Examination-Activity Limitations Lift    Stability/Clinical Decision Making Stable/Uncomplicated    Clinical Decision Making Low    Rehab Potential Excellent    PT Frequency 1x / week    PT Duration --   1 wk   PT Treatment/Interventions Therapeutic exercise;Patient/family education    PT Next Visit Plan D/C    PT Home Exercise Plan cervical ROM exercises, scapular retraction           Patient will benefit from skilled therapeutic intervention in order to improve the following deficits and impairments:  Decreased range of motion, Postural dysfunction  Visit Diagnosis: Radiculopathy, cervical region - Plan: PT plan of care cert/re-cert     Problem List Patient Active Problem List   Diagnosis Date Noted  . CLL (chronic lymphocytic leukemia) ([place]) 02/02/2019  . Leukocytosis 01/07/2019  . Special screening for malignant neoplasms, colon 09/06/2017  . Essential hypertension 08/26/2017  . Insomnia 03/08/2014  . Erectile dysfunction 06/11/2013  . Low back pain 08/12/2012   Rayetta Humphrey, PT CLT [phone] 01/27/2020, 9:15 AM  [facility] [address] [place], [place], [address] Phone: [phone]   Fax:  [phone]  Name: Brandon Vega MRN: [id number] Date of  Birth:  11/04/1950

## 2020-02-03 ENCOUNTER — Other Ambulatory Visit: Payer: Self-pay

## 2020-02-03 ENCOUNTER — Inpatient Hospital Stay (HOSPITAL_BASED_OUTPATIENT_CLINIC_OR_DEPARTMENT_OTHER): Payer: Medicare Other | Admitting: Hematology

## 2020-02-03 VITALS — BP 114/64 | HR 53 | Temp 96.9°F | Resp 18 | Wt 192.4 lb

## 2020-02-03 DIAGNOSIS — C9110 Chronic lymphocytic leukemia of B-cell type not having achieved remission: Secondary | ICD-10-CM | POA: Diagnosis not present

## 2020-02-03 NOTE — Patient Instructions (Signed)
[facility] Discharge Instructions  You were seen today by Dr. Katragadda. He went over your recent results. Dr. Katragadda will see you back in 6 months for labs and follow up.   Thank you for choosing [facility] at [facility] to provide your oncology and hematology care.  To afford each patient quality time with our provider, please arrive at least 15 minutes before your scheduled appointment time.   If you have a lab appointment with the Cancer Center please come in thru the Main Entrance and check in at the main information desk  You need to re-schedule your appointment should you arrive 10 or more minutes late.  We strive to give you quality time with our providers, and arriving late affects you and other patients whose appointments are after yours.  Also, if you no show three or more times for appointments you may be dismissed from the clinic at the providers discretion.     Again, thank you for choosing [facility].  Our hope is that these requests will decrease the amount of time that you wait before being seen by our physicians.       _____________________________________________________________  Should you have questions after your visit to  Cancer Center, please contact our office at [phone] between the hours of 8:00 a.m. and 4:30 p.m.  Voicemails left after 4:00 p.m. will not be returned until the following business day.  For prescription refill requests, have your pharmacy contact our office and allow 72 hours.    Cancer Center Support Programs:   > Cancer Support Group  2nd Tuesday of the month 1pm-2pm, Journey Room    

## 2020-02-03 NOTE — Progress Notes (Signed)
[place], [place] [address]   CLINIC:  Medical Oncology/Hematology  PCP:  Erven Colla, DO [address] / [place] [address]  [phone]  REASON FOR VISIT:  Follow-up for CLL  PRIOR THERAPY: None  CURRENT THERAPY: Observation  INTERVAL HISTORY:  [name], a 69 y.o. 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 Brandon Vega was last seen on 08/03/2019.  Today he reports feeling good and denies having any recent infections, F/C, night sweats or weight loss. He denies noticing any adenopathy.   REVIEW OF SYSTEMS:  Review of Systems  Constitutional: Positive for fatigue (mild). Negative for appetite change, chills, diaphoresis, fever and unexpected weight change.  Hematological: Negative for adenopathy.  All other systems reviewed and are negative.   PAST MEDICAL/SURGICAL HISTORY:  Past Medical History:  Diagnosis Date  . Allergy   . ED (erectile dysfunction)    Past Surgical History:  Procedure Laterality Date  . COLONOSCOPY N/A 12/26/2017   Procedure: COLONOSCOPY;  Surgeon: Rogene Houston, MD;  [facility];  Service: Endoscopy;  Laterality: N/A;  930  . VASECTOMY      SOCIAL HISTORY:  Social History   Socioeconomic History  . Marital status: Married    Spouse name: Not on file  . Number of children: Not on file  . Years of education: Not on file  . Highest education level: Not on file  Occupational History  . Not on file  Tobacco Use  . Smoking status: Current Every Day Smoker    Types: Pipe  . Smokeless tobacco: Never Used  Substance and Sexual Activity  . Alcohol use: Yes    Alcohol/week: 1.0 standard drink    Types: 1 Glasses of wine per week  . Drug use: Never  . Sexual activity: Not on file  Other Topics Concern  . Not on file  Social History Narrative  . Not on file   Social Determinants of Health   Financial Resource Strain:   . Difficulty of Paying Living Expenses: Not  on file  Food Insecurity:   . Worried About Charity fundraiser in the Last Year: Not on file  . Ran Out of Food in the Last Year: Not on file  Transportation Needs:   . Lack of Transportation (Medical): Not on file  . Lack of Transportation (Non-Medical): Not on file  Physical Activity:   . Days of Exercise per Week: Not on file  . Minutes of Exercise per Session: Not on file  Stress:   . Feeling of Stress : Not on file  Social Connections:   . Frequency of Communication with Friends and Family: Not on file  . Frequency of Social Gatherings with Friends and Family: Not on file  . Attends Religious Services: Not on file  . Active Member of Clubs or Organizations: Not on file  . Attends Archivist Meetings: Not on file  . Marital Status: Not on file  Intimate Partner Violence:   . Fear of Current or Ex-Partner: Not on file  . Emotionally Abused: Not on file  . Physically Abused: Not on file  . Sexually Abused: Not on file    FAMILY HISTORY:  Family History  Problem Relation Age of Onset  . Cancer Mother        throat  . Hypertension Father   . Diabetes Father   . Hyperlipidemia Father   . Cancer Father  lung  . Cancer Other        breast  . Heart attack Other     CURRENT MEDICATIONS:  Current Outpatient Medications  Medication Sig Dispense Refill  . amLODipine (NORVASC) 5 MG tablet Take 1 tablet (5 mg total) by mouth at bedtime. 90 tablet 1  . aspirin EC 81 MG tablet Take 81 mg by mouth daily.    . diclofenac (VOLTAREN) 75 MG EC tablet Take 1 tablet (75 mg total) by mouth 2 (two) times daily. 30 tablet 0  . Misc Natural Products (PROSTATE HEALTH PO) Prostate Health    . Multiple Vitamins-Minerals (CENTRUM SILVER PO) Take 1 tablet by mouth daily.    . predniSONE (DELTASONE) 10 MG tablet Take 4 tab p.o. x 2 days, then 3 tab x 2 day, then 2 tab x 2 days, then 1 tab x 2 day. 20 tablet 0  . tizanidine (ZANAFLEX) 2 MG capsule Take 1 capsule (2 mg total) by  mouth 3 (three) times daily as needed for muscle spasms. 15 capsule 0   No current facility-administered medications for this visit.    ALLERGIES:  Allergies  Allergen Reactions  . Ace Inhibitors     cough    PHYSICAL EXAM:  Performance status (ECOG): 1 - Symptomatic but completely ambulatory  There were no vitals filed for this visit. Wt Readings from Last 3 Encounters:  12/07/19 191 lb 9.6 oz (86.9 kg)  11/26/19 192 lb (87.1 kg)  09/30/19 192 lb 12.8 oz (87.5 kg)   Physical Exam Vitals reviewed.  Constitutional:      Appearance: Normal appearance.  Cardiovascular:     Rate and Rhythm: Normal rate and regular rhythm.     Pulses: Normal pulses.     Heart sounds: Normal heart sounds.  Pulmonary:     Effort: Pulmonary effort is normal.     Breath sounds: Normal breath sounds.  Abdominal:     Palpations: Abdomen is soft. There is no hepatomegaly, splenomegaly or mass.     Tenderness: There is no abdominal tenderness.     Hernia: No hernia is present.  Lymphadenopathy:     Upper Body:     Right upper body: No supraclavicular, axillary or pectoral adenopathy.     Left upper body: No supraclavicular, axillary or pectoral adenopathy.     Lower Body: No right inguinal adenopathy. No left inguinal adenopathy.  Neurological:     General: No focal deficit present.     Mental Status: He is alert and oriented to person, place, and time.  Psychiatric:        Mood and Affect: Mood normal.        Behavior: Behavior normal.     LABORATORY DATA:  I have reviewed the labs as listed.  CBC Latest Ref Rng & Units 01/27/2020 07/27/2019 01/07/2019  WBC 4.0 - 10.5 K/uL 11.2(H) 11.7(H) 12.2(H)  Hemoglobin 13.0 - 17.0 g/dL 14.9 14.6 13.9  Hematocrit 39 - 52 % 47.3 46.2 44.1  Platelets 150 - 400 K/uL 235 235 280   CMP Latest Ref Rng & Units 01/27/2020 07/27/2019 12/15/2018  Glucose 70 - 99 mg/dL 92 94 98  BUN 8 - 23 mg/dL 14 18 12   Creatinine 0.61 - 1.24 mg/dL 0.88 0.93 0.87  Sodium 135 -  145 mmol/L 138 139 139  Potassium 3.5 - 5.1 mmol/L 4.2 4.2 4.3  Chloride 98 - 111 mmol/L 102 102 102  CO2 22 - 32 mmol/L 26 28 22   Calcium 8.9 -  10.3 mg/dL 9.3 9.6 9.6  Total Protein 6.5 - 8.1 g/dL 7.6 7.6 7.5  Total Bilirubin 0.3 - 1.2 mg/dL 1.9(H) 1.4(H) 1.2  Alkaline Phos 38 - 126 U/L 54 59 87  AST 15 - 41 U/L 27 24 23   ALT 0 - 44 U/L 21 19 17       Component Value Date/Time   RBC 4.81 01/27/2020 0923   MCV 98.3 01/27/2020 0923   MCV 91 12/15/2018 0953   MCH 31.0 01/27/2020 0923   MCHC 31.5 01/27/2020 0923   RDW 12.9 01/27/2020 0923   RDW 11.7 12/15/2018 0953   LYMPHSABS 5.1 (H) 01/27/2020 0923   LYMPHSABS 6.1 (H) 12/15/2018 0953   MONOABS 0.9 01/27/2020 0923   EOSABS 0.4 01/27/2020 0923   EOSABS 0.3 12/15/2018 0953   BASOSABS 0.1 01/27/2020 0923   BASOSABS 0.1 12/15/2018 0953   Lab Results  Component Value Date   LDH 135 01/27/2020   LDH 125 07/27/2019   LDH 123 01/07/2019    DIAGNOSTIC IMAGING:  I have independently reviewed the scans and discussed with the patient. No results found.   ASSESSMENT:  1.  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 -Denies any fevers, night sweats or weight loss.  Denies any recurrent infections or hospitalizations. -Peripheral blood flow cytometry from 01/08/2019 showed CD5 positive monoclonal B-cell population, consistent with CLL.  2.  Health maintenance: -Colonoscopy on 12/26/2016 shows diverticulosis of the sigmoid colon external hemorrhoids.  3.  Smoking history: -Does not smoke cigarettes.  However smokes pipe every day for past 30+ years.   PLAN:  1.  Stage 0 CLL: -He does not have any recurrent infections, fevers, night sweats or weight loss in the last 6 months. -He has mild fatigue and reports that he has not able to run but only able to jog. -He could not go to [place] on a mission trip last year and this year because of Covid.  He plans to go 1 last time next year. -I reviewed his labs.   White count is stable at 11.2.  LDH is normal. -He always had mildly elevated total bilirubin which is also stable. -Physical exam did not reveal any palpable adenopathy or splenomegaly.  RTC 6 months with repeat labs.     Orders placed this encounter:  No orders of the defined types were placed in this encounter.    Derek Jack, MD [facility] [place] [phone]   I, Milinda Antis, am acting as a scribe for Dr. Sanda Linger.  I, Derek Jack MD, have reviewed the above documentation for accuracy and completeness, and I agree with the above.

## 2020-02-19 ENCOUNTER — Telehealth: Payer: Self-pay

## 2020-02-19 NOTE — Telephone Encounter (Signed)
Patient called stating you were going to take him as your patient instead of Dr. Lovena Le. He is the spouse to Oda Cogan who is your patient. He was Dr. Richardson Landry patient. Please advise

## 2020-02-19 NOTE — Telephone Encounter (Signed)
I take care of his wife it makes sense for me to take care of him as well Please make the switch May schedule with m,e

## 2020-02-19 NOTE — Telephone Encounter (Signed)
Please contact pt. Thank you

## 2020-05-27 ENCOUNTER — Other Ambulatory Visit: Payer: Self-pay | Admitting: *Deleted

## 2020-05-27 MED ORDER — AMLODIPINE BESYLATE 5 MG PO TABS
5.0000 mg | ORAL_TABLET | Freq: Every day | ORAL | 0 refills | Status: DC
Start: 2020-05-27 — End: 2020-08-29

## 2020-08-01 ENCOUNTER — Telehealth: Payer: Self-pay

## 2020-08-01 NOTE — Telephone Encounter (Signed)
Patient has an appt with [facility] at 1pm 08/03/20 and a follow up with Dr Lodema Pilot to discuss 08/10/20  Patient notified and verbalized understanding.

## 2020-08-01 NOTE — Telephone Encounter (Signed)
Brandon Vega called and said he needs to have lab work ordered before 03/23 to the Lab he was thinking they have to draw blood before then.   Pt call back [phone]

## 2020-08-03 ENCOUNTER — Inpatient Hospital Stay (HOSPITAL_COMMUNITY): Payer: Medicare Other | Attending: Hematology

## 2020-08-03 ENCOUNTER — Other Ambulatory Visit: Payer: Self-pay

## 2020-08-03 DIAGNOSIS — C9110 Chronic lymphocytic leukemia of B-cell type not having achieved remission: Secondary | ICD-10-CM | POA: Diagnosis present

## 2020-08-03 DIAGNOSIS — Z833 Family history of diabetes mellitus: Secondary | ICD-10-CM | POA: Diagnosis not present

## 2020-08-03 DIAGNOSIS — Z7952 Long term (current) use of systemic steroids: Secondary | ICD-10-CM | POA: Diagnosis not present

## 2020-08-03 DIAGNOSIS — Z8349 Family history of other endocrine, nutritional and metabolic diseases: Secondary | ICD-10-CM | POA: Insufficient documentation

## 2020-08-03 DIAGNOSIS — Z801 Family history of malignant neoplasm of trachea, bronchus and lung: Secondary | ICD-10-CM | POA: Insufficient documentation

## 2020-08-03 DIAGNOSIS — Z803 Family history of malignant neoplasm of breast: Secondary | ICD-10-CM | POA: Insufficient documentation

## 2020-08-03 DIAGNOSIS — Z7982 Long term (current) use of aspirin: Secondary | ICD-10-CM | POA: Insufficient documentation

## 2020-08-03 DIAGNOSIS — F17210 Nicotine dependence, cigarettes, uncomplicated: Secondary | ICD-10-CM | POA: Insufficient documentation

## 2020-08-03 DIAGNOSIS — Z8249 Family history of ischemic heart disease and other diseases of the circulatory system: Secondary | ICD-10-CM | POA: Diagnosis not present

## 2020-08-03 DIAGNOSIS — Z79899 Other long term (current) drug therapy: Secondary | ICD-10-CM | POA: Diagnosis not present

## 2020-08-03 LAB — CBC WITH DIFFERENTIAL/PLATELET
Abs Immature Granulocytes: 0.03 10*3/uL (ref 0.00–0.07)
Basophils Absolute: 0.1 10*3/uL (ref 0.0–0.1)
Basophils Relative: 1 %
Eosinophils Absolute: 0.2 10*3/uL (ref 0.0–0.5)
Eosinophils Relative: 2 %
HCT: 46.2 % (ref 39.0–52.0)
Hemoglobin: 15 g/dL (ref 13.0–17.0)
Immature Granulocytes: 0 %
Lymphocytes Relative: 39 %
Lymphs Abs: 4.4 10*3/uL — ABNORMAL HIGH (ref 0.7–4.0)
MCH: 31.1 pg (ref 26.0–34.0)
MCHC: 32.5 g/dL (ref 30.0–36.0)
MCV: 95.9 fL (ref 80.0–100.0)
Monocytes Absolute: 0.7 10*3/uL (ref 0.1–1.0)
Monocytes Relative: 6 %
Neutro Abs: 6 10*3/uL (ref 1.7–7.7)
Neutrophils Relative %: 52 %
Platelets: 241 10*3/uL (ref 150–400)
RBC: 4.82 MIL/uL (ref 4.22–5.81)
RDW: 12.6 % (ref 11.5–15.5)
WBC: 11.5 10*3/uL — ABNORMAL HIGH (ref 4.0–10.5)
nRBC: 0 % (ref 0.0–0.2)

## 2020-08-03 LAB — COMPREHENSIVE METABOLIC PANEL
ALT: 21 U/L (ref 0–44)
AST: 28 U/L (ref 15–41)
Albumin: 3.8 g/dL (ref 3.5–5.0)
Alkaline Phosphatase: 62 U/L (ref 38–126)
Anion gap: 9 (ref 5–15)
BUN: 17 mg/dL (ref 8–23)
CO2: 25 mmol/L (ref 22–32)
Calcium: 9.2 mg/dL (ref 8.9–10.3)
Chloride: 104 mmol/L (ref 98–111)
Creatinine, Ser: 0.95 mg/dL (ref 0.61–1.24)
GFR, Estimated: >60 mL/min (ref >60)
Glucose, Bld: 109 mg/dL — ABNORMAL HIGH (ref 70–99)
Potassium: 4 mmol/L (ref 3.5–5.1)
Sodium: 138 mmol/L (ref 135–145)
Total Bilirubin: 1.4 mg/dL — ABNORMAL HIGH (ref 0.3–1.2)
Total Protein: 7.1 g/dL (ref 6.5–8.1)

## 2020-08-03 LAB — LACTATE DEHYDROGENASE: LDH: 137 U/L (ref 98–192)

## 2020-08-10 ENCOUNTER — Other Ambulatory Visit: Payer: Self-pay

## 2020-08-10 ENCOUNTER — Inpatient Hospital Stay (HOSPITAL_BASED_OUTPATIENT_CLINIC_OR_DEPARTMENT_OTHER): Payer: Medicare Other | Admitting: Hematology

## 2020-08-10 VITALS — BP 134/69 | HR 58 | Temp 97.2°F | Resp 18 | Wt 192.8 lb

## 2020-08-10 DIAGNOSIS — C9110 Chronic lymphocytic leukemia of B-cell type not having achieved remission: Secondary | ICD-10-CM

## 2020-08-10 NOTE — Progress Notes (Signed)
[facility] [place], [place] [address]   CLINIC:  Medical Oncology/Hematology  PCP:  Kathyrn Drown, MD [address] [place] / [place] [address]  [phone]  REASON FOR VISIT:  Follow-up for CLL  PRIOR THERAPY: None  CURRENT THERAPY: Observation  INTERVAL HISTORY:  [name], a 70 y.o. 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 Brandon Vega was last seen on 02/03/2020.  Today Brandon Vega reports feeling well. Brandon Vega notes having intermittent night sweats, though not drenching. Brandon Vega denies having any adenopathy.  Brandon Vega is planning on going to [place] in June for 2 weeks.   REVIEW OF SYSTEMS:  Review of Systems  Constitutional: Positive for diaphoresis (intermittent) and fatigue (75%). Negative for appetite change.  Hematological: Negative for adenopathy.  All other systems reviewed and are negative.   PAST MEDICAL/SURGICAL HISTORY:  Past Medical History:  Diagnosis Date  . Allergy   . ED (erectile dysfunction)    Past Surgical History:  Procedure Laterality Date  . COLONOSCOPY N/A 12/26/2017   Procedure: COLONOSCOPY;  Surgeon: Rogene Houston, MD;  Location: [facility];  Service: Endoscopy;  Laterality: N/A;  930  . VASECTOMY      SOCIAL HISTORY:  Social History   Socioeconomic History  . Marital status: Married    Spouse name: Not on file  . Number of children: Not on file  . Years of education: Not on file  . Highest education level: Not on file  Occupational History  . Not on file  Tobacco Use  . Smoking status: Current Every Day Smoker    Types: Pipe  . Smokeless tobacco: Never Used  Substance and Sexual Activity  . Alcohol use: Yes    Alcohol/week: 1.0 standard drink    Types: 1 Glasses of wine per week  . Drug use: Never  . Sexual activity: Not on file  Other Topics Concern  . Not on file  Social History Narrative  . Not on file   Social Determinants of Health   Financial Resource Strain: Not on file  Food  Insecurity: Not on file  Transportation Needs: Not on file  Physical Activity: Not on file  Stress: Not on file  Social Connections: Not on file  Intimate Partner Violence: Not on file    FAMILY HISTORY:  Family History  Problem Relation Age of Onset  . Cancer Mother        throat  . Hypertension Father   . Diabetes Father   . Hyperlipidemia Father   . Cancer Father        lung  . Cancer Other        breast  . Heart attack Other     CURRENT MEDICATIONS:  Current Outpatient Medications  Medication Sig Dispense Refill  . amLODipine (NORVASC) 5 MG tablet Take 1 tablet (5 mg total) by mouth at bedtime. 90 tablet 0  . Misc Natural Products (PROSTATE HEALTH PO) Prostate Health    . aspirin EC 81 MG tablet Take 81 mg by mouth daily. (Patient not taking: Reported on 08/10/2020)    . predniSONE (DELTASONE) 10 MG tablet Take 4 tab p.o. x 2 days, then 3 tab x 2 day, then 2 tab x 2 days, then 1 tab x 2 day. (Patient not taking: Reported on 08/10/2020) 20 tablet 0   No current facility-administered medications for this visit.    ALLERGIES:  Allergies  Allergen Reactions  . Ace Inhibitors  cough    PHYSICAL EXAM:  Performance status (ECOG): 1 - Symptomatic but completely ambulatory  Vitals:   08/10/20 1454  BP: 134/69  Pulse: (!) 58  Resp: 18  Temp: (!) 97.2 F (36.2 C)  SpO2: 98%   Wt Readings from Last 3 Encounters:  08/10/20 192 lb 12.8 oz (87.5 kg)  02/03/20 192 lb 6.4 oz (87.3 kg)  12/07/19 191 lb 9.6 oz (86.9 kg)   Physical Exam Vitals reviewed.  Constitutional:      Appearance: Normal appearance.  Cardiovascular:     Rate and Rhythm: Normal rate and regular rhythm.     Pulses: Normal pulses.     Heart sounds: Normal heart sounds.  Pulmonary:     Effort: Pulmonary effort is normal.     Breath sounds: Normal breath sounds.  Chest:  Breasts:     Right: No axillary adenopathy or supraclavicular adenopathy.     Left: No axillary adenopathy or  supraclavicular adenopathy.    Abdominal:     Palpations: Abdomen is soft. There is no hepatomegaly or mass.     Tenderness: There is no abdominal tenderness.  Lymphadenopathy:     Cervical: No cervical adenopathy.     Upper Body:     Right upper body: No supraclavicular, axillary or pectoral adenopathy.     Left upper body: No supraclavicular, axillary or pectoral adenopathy.  Neurological:     General: No focal deficit present.     Mental Status: Brandon Vega is alert and oriented to person, place, and time.  Psychiatric:        Mood and Affect: Mood normal.        Behavior: Behavior normal.     LABORATORY DATA:  I have reviewed the labs as listed.  CBC Latest Ref Rng & Units 08/03/2020 01/27/2020 07/27/2019  WBC 4.0 - 10.5 K/uL 11.5(H) 11.2(H) 11.7(H)  Hemoglobin 13.0 - 17.0 g/dL 15.0 14.9 14.6  Hematocrit 39.0 - 52.0 % 46.2 47.3 46.2  Platelets 150 - 400 K/uL 241 235 235   CMP Latest Ref Rng & Units 08/03/2020 01/27/2020 07/27/2019  Glucose 70 - 99 mg/dL 109(H) 92 94  BUN 8 - 23 mg/dL 17 14 18   Creatinine 0.61 - 1.24 mg/dL 0.95 0.88 0.93  Sodium 135 - 145 mmol/L 138 138 139  Potassium 3.5 - 5.1 mmol/L 4.0 4.2 4.2  Chloride 98 - 111 mmol/L 104 102 102  CO2 22 - 32 mmol/L 25 26 28   Calcium 8.9 - 10.3 mg/dL 9.2 9.3 9.6  Total Protein 6.5 - 8.1 g/dL 7.1 7.6 7.6  Total Bilirubin 0.3 - 1.2 mg/dL 1.4(H) 1.9(H) 1.4(H)  Alkaline Phos 38 - 126 U/L 62 54 59  AST 15 - 41 U/L 28 27 24   ALT 0 - 44 U/L 21 21 19       Component Value Date/Time   RBC 4.82 08/03/2020 1256   MCV 95.9 08/03/2020 1256   MCV 91 12/15/2018 0953   MCH 31.1 08/03/2020 1256   MCHC 32.5 08/03/2020 1256   RDW 12.6 08/03/2020 1256   RDW 11.7 12/15/2018 0953   LYMPHSABS 4.4 (H) 08/03/2020 1256   LYMPHSABS 6.1 (H) 12/15/2018 0953   MONOABS 0.7 08/03/2020 1256   EOSABS 0.2 08/03/2020 1256   EOSABS 0.3 12/15/2018 0953   BASOSABS 0.1 08/03/2020 1256   BASOSABS 0.1 12/15/2018 0953   Lab Results  Component Value Date    LDH 137 08/03/2020   LDH 135 01/27/2020   LDH 125 07/27/2019  DIAGNOSTIC IMAGING:  I have independently reviewed the scans and discussed with the patient. No results found.   ASSESSMENT:  1. 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 -Denies any fevers, night sweats or weight loss. Denies any recurrent infections or hospitalizations. -Peripheral blood flow cytometry from 01/08/2019 showed CD5 positive monoclonal B-cell population, consistent with CLL.  2. Health maintenance: -Colonoscopy on 12/26/2016 shows diverticulosis of the sigmoid colon external hemorrhoids.  3. Smoking history: -Does not smoke cigarettes. However smokes pipe every day for past 30+ years.   PLAN:  1. Stage 0 CLL: -Brandon Vega denies any infections in the past 6 months. -Denies any fevers, night sweats or weight loss. -Physical examination today did not reveal any palpable adenopathy or splenomegaly. -Reviewed his labs which showed normal LFTs.  LDH was normal.  White count is elevated at 11.5 and stable.  Predominantly lymphocytes on differential.  Hemoglobin and platelets were normal. -Brandon Vega is planning on missionary trip to [place] this June.  Brandon Vega will be seen back in 6 months for follow-up with repeat labs and physical exam.  Orders placed this encounter:  Orders Placed This Encounter  Procedures  . CBC with Differential/Platelet  . Comprehensive metabolic panel  . Lactate dehydrogenase     Derek Jack, MD [place] [phone]   I, Milinda Antis, am acting as a scribe for Dr. Sanda Linger.  I, Derek Jack MD, have reviewed the above documentation for accuracy and completeness, and I agree with the above.

## 2020-08-10 NOTE — Patient Instructions (Addendum)
[facility] at [facility] Discharge Instructions  You were seen today by Dr. Katragadda. He went over your recent results. Dr. Katragadda will see you back in 6 months for labs and follow up.   Thank you for choosing  Cancer Center at [facility] to provide your oncology and hematology care.  To afford each patient quality time with our provider, please arrive at least 15 minutes before your scheduled appointment time.   If you have a lab appointment with the Cancer Center please come in thru the Main Entrance and check in at the main information desk  You need to re-schedule your appointment should you arrive 10 or more minutes late.  We strive to give you quality time with our providers, and arriving late affects you and other patients whose appointments are after yours.  Also, if you no show three or more times for appointments you may be dismissed from the clinic at the providers discretion.     Again, thank you for choosing [facility].  Our hope is that these requests will decrease the amount of time that you wait before being seen by our physicians.       _____________________________________________________________  Should you have questions after your visit to [facility], please contact our office at [phone] between the hours of 8:00 a.m. and 4:30 p.m.  Voicemails left after 4:00 p.m. will not be returned until the following business day.  For prescription refill requests, have your pharmacy contact our office and allow 72 hours.    Cancer Center Support Programs:   > Cancer Support Group  2nd Tuesday of the month 1pm-2pm, Journey Room    

## 2020-08-29 ENCOUNTER — Other Ambulatory Visit: Payer: Self-pay | Admitting: Family Medicine

## 2020-08-29 ENCOUNTER — Other Ambulatory Visit: Payer: Self-pay

## 2020-08-29 ENCOUNTER — Telehealth: Payer: Self-pay | Admitting: Family Medicine

## 2020-08-29 DIAGNOSIS — Z13220 Encounter for screening for lipoid disorders: Secondary | ICD-10-CM

## 2020-08-29 DIAGNOSIS — Z0000 Encounter for general adult medical examination without abnormal findings: Secondary | ICD-10-CM

## 2020-08-29 DIAGNOSIS — Z79899 Other long term (current) drug therapy: Secondary | ICD-10-CM

## 2020-08-29 DIAGNOSIS — Z125 Encounter for screening for malignant neoplasm of prostate: Secondary | ICD-10-CM

## 2020-08-29 DIAGNOSIS — I10 Essential (primary) hypertension: Secondary | ICD-10-CM

## 2020-08-29 MED ORDER — AMLODIPINE BESYLATE 5 MG PO TABS: 5.0000 mg | ORAL_TABLET | Freq: Every day | ORAL | 0 refills | Status: DC

## 2020-08-29 NOTE — Addendum Note (Signed)
Addended by: Vicente Males on: 08/29/2020 11:43 AM   Modules accepted: Orders

## 2020-08-29 NOTE — Telephone Encounter (Signed)
PSA, CMP, lipid, schedule wellness checkup for either May or June, may have 90-day of amlodipine

## 2020-08-29 NOTE — Telephone Encounter (Signed)
[facility] on [address] requesting refill on Amlodipine 5 mg tablets. Pt last seen in July 2021 by Dr.Taylor. please advise. Thank you

## 2020-08-29 NOTE — Telephone Encounter (Signed)
Pt is aware and transferred up front to schedule wellness. However when I go to sign lab orders, it will not let me sign for the PSA. The option "ABN TRIGGERED INCORRECTLY" is no longer available. I printed the noticed off and was going to mail it to patient along with labs but still could not sign. Please advise. Thank you.

## 2020-08-29 NOTE — Telephone Encounter (Signed)
Was able to place lab order in under wellness.

## 2020-09-22 LAB — COMPREHENSIVE METABOLIC PANEL
ALT: 19 IU/L (ref 0–44)
AST: 27 IU/L (ref 0–40)
Albumin/Globulin Ratio: 1.7 (ref 1.2–2.2)
Albumin: 4.5 g/dL (ref 3.7–4.7)
Alkaline Phosphatase: 77 IU/L (ref 44–121)
BUN/Creatinine Ratio: 15 (ref 10–24)
BUN: 16 mg/dL (ref 8–27)
Bilirubin Total: 1.4 mg/dL — ABNORMAL HIGH (ref 0.0–1.2)
CO2: 24 mmol/L (ref 20–29)
Calcium: 9.8 mg/dL (ref 8.6–10.2)
Chloride: 103 mmol/L (ref 96–106)
Creatinine, Ser: 1.07 mg/dL (ref 0.76–1.27)
Globulin, Total: 2.7 g/dL (ref 1.5–4.5)
Glucose: 92 mg/dL (ref 65–99)
Potassium: 4.5 mmol/L (ref 3.5–5.2)
Sodium: 141 mmol/L (ref 134–144)
Total Protein: 7.2 g/dL (ref 6.0–8.5)
eGFR: 74 mL/min/{1.73_m2} (ref >59)

## 2020-09-22 LAB — LIPID PANEL
Chol/HDL Ratio: 3.9 ratio (ref 0.0–5.0)
Cholesterol, Total: 199 mg/dL (ref 100–199)
HDL: 51 mg/dL (ref >39)
LDL Chol Calc (NIH): 132 mg/dL — ABNORMAL HIGH (ref 0–99)
Triglycerides: 88 mg/dL (ref 0–149)
VLDL Cholesterol Cal: 16 mg/dL (ref 5–40)

## 2020-09-22 LAB — PSA: Prostate Specific Ag, Serum: 0.2 ng/mL (ref 0.0–4.0)

## 2020-09-28 ENCOUNTER — Encounter: Payer: Self-pay | Admitting: Family Medicine

## 2020-09-28 ENCOUNTER — Other Ambulatory Visit: Payer: Self-pay

## 2020-09-28 ENCOUNTER — Telehealth: Payer: Self-pay | Admitting: Family Medicine

## 2020-09-28 ENCOUNTER — Ambulatory Visit (INDEPENDENT_AMBULATORY_CARE_PROVIDER_SITE_OTHER): Payer: Medicare Other | Admitting: Family Medicine

## 2020-09-28 VITALS — BP 118/74 | HR 55 | Temp 97.2°F | Ht 69.0 in | Wt 192.0 lb

## 2020-09-28 DIAGNOSIS — Z7184 Encounter for health counseling related to travel: Secondary | ICD-10-CM

## 2020-09-28 DIAGNOSIS — Z13220 Encounter for screening for lipoid disorders: Secondary | ICD-10-CM | POA: Diagnosis not present

## 2020-09-28 DIAGNOSIS — Z0000 Encounter for general adult medical examination without abnormal findings: Secondary | ICD-10-CM | POA: Diagnosis not present

## 2020-09-28 DIAGNOSIS — C9110 Chronic lymphocytic leukemia of B-cell type not having achieved remission: Secondary | ICD-10-CM

## 2020-09-28 DIAGNOSIS — Z136 Encounter for screening for cardiovascular disorders: Secondary | ICD-10-CM | POA: Diagnosis not present

## 2020-09-28 DIAGNOSIS — I10 Essential (primary) hypertension: Secondary | ICD-10-CM

## 2020-09-28 NOTE — Progress Notes (Addendum)
Subjective:    Patient ID: Brandon Vega, male    DOB: November 04, 1950, 70 y.o.   MRN: [id number]  HPI  AWV- Annual Wellness Visit Traveling to [place] in 1 month The patient was seen for their annual wellness visit. The patient's past medical history, surgical history, and family history were reviewed. Pertinent vaccines were reviewed ( tetanus, pneumonia, shingles, flu) The patient's medication list was reviewed and updated.  The height and weight were entered.  BMI recorded in electronic record elsewhere  Cognitive screening was completed. Outcome of Mini - Cog: 5   Falls /depression screening electronically recorded within record elsewhere  Current tobacco usage:pipe (All patients who use tobacco were given written and verbal information on quitting)  Recent listing of emergency department/hospitalizations over the past year were reviewed.  current specialist the patient sees on a regular basis: oncology specialist    Medicare annual wellness visit patient questionnaire was reviewed.  A written screening schedule for the patient for the next 5-10 years was given. Appropriate discussion of followup regarding next visit was discussed.  Results for orders placed or performed in visit on 08/29/20  PSA  Result Value Ref Range   Prostate Specific Ag, Serum 0.2 0.0 - 4.0 ng/mL  Comprehensive Metabolic Panel (CMET)  Result Value Ref Range   Glucose 92 65 - 99 mg/dL   BUN 16 8 - 27 mg/dL   Creatinine, Ser 1.07 0.76 - 1.27 mg/dL   eGFR 74 >59 mL/min/1.73   BUN/Creatinine Ratio 15 10 - 24   Sodium 141 134 - 144 mmol/L   Potassium 4.5 3.5 - 5.2 mmol/L   Chloride 103 96 - 106 mmol/L   CO2 24 20 - 29 mmol/L   Calcium 9.8 8.6 - 10.2 mg/dL   Total Protein 7.2 6.0 - 8.5 g/dL   Albumin 4.5 3.7 - 4.7 g/dL   Globulin, Total 2.7 1.5 - 4.5 g/dL   Albumin/Globulin Ratio 1.7 1.2 - 2.2   Bilirubin Total 1.4 (H) 0.0 - 1.2 mg/dL   Alkaline Phosphatase 77 44 - 121 IU/L   AST 27 0 - 40 IU/L    ALT 19 0 - 44 IU/L  Lipid Profile  Result Value Ref Range   Cholesterol, Total 199 100 - 199 mg/dL   Triglycerides 88 0 - 149 mg/dL   HDL 51 >39 mg/dL   VLDL Cholesterol Cal 16 5 - 40 mg/dL   LDL Chol Calc (NIH) 132 (H) 0 - 99 mg/dL   Chol/HDL Ratio 3.9 0.0 - 5.0 ratio    Patient was counseled to quit smoking a pipe unlikely he will do so  Review of Systems     Objective:   Physical Exam Vitals reviewed.  Constitutional:      General: He is not in acute distress. HENT:     Head: Normocephalic and atraumatic.  Eyes:     General:        Right eye: No discharge.        Left eye: No discharge.  Neck:     Trachea: No tracheal deviation.  Cardiovascular:     Rate and Rhythm: Normal rate and regular rhythm.     Heart sounds: Normal heart sounds. No murmur heard.   Pulmonary:     Effort: Pulmonary effort is normal. No respiratory distress.     Breath sounds: Normal breath sounds.  Lymphadenopathy:     Cervical: No cervical adenopathy.  Skin:    General: Skin is warm and dry.  Neurological:  Mental Status: He is alert.     Coordination: Coordination normal.  Psychiatric:        Behavior: Behavior normal.    Prostate normal       Assessment & Plan:  1. Well adult exam Adult wellness-complete.wellness physical was conducted today. Importance of diet and exercise were discussed in detail.  In addition to this a discussion regarding safety was also covered. We also reviewed over immunizations and gave recommendations regarding current immunization needed for age.  In addition to this additional areas were also touched on including: Preventative health exams needed:  Colonoscopy 2029  Patient was advised yearly wellness exam covid booster #2 recommended Pt states he is utd on tet shot 2. Screening for AAA (abdominal aortic aneurysm) Pipe smoker - [place] MEDICARE SCREENING  3. Screening, lipid screening-patient does not want to start statin at this point  he states he will watch his diet and recheck the labs in 6 months - Lipid panel  4. Counseling about travel Going to [place] malaria med-mefloquine please see telephone message  5. Essential hypertension bp good continue med  6. CLL (chronic lymphocytic leukemia) ([place]) Under oncology

## 2020-09-28 NOTE — Patient Instructions (Signed)

## 2020-09-28 NOTE — Telephone Encounter (Signed)
Nurses  Please call Brandon Vega He will be going to [place] in a few weeks for a missions trip for 2 weeks Malaria prophylaxis is recommended as we discussed at his visit today Mefloquine 250 mg taken once per week.  Start 2 weeks before leaving, take each week while gone, and take once per week for 4 for additional weeks  A total of 8 tablets  This should be taken with a tall glass of water and a snack Some people this can cause nausea but he has taken it before  Please clarify which pharmacy he would like this sent to Then send the prescription exactly as stated above  Please document his understanding thank you

## 2020-09-29 ENCOUNTER — Other Ambulatory Visit: Payer: Self-pay | Admitting: *Deleted

## 2020-09-29 MED ORDER — MEFLOQUINE HCL 250 MG PO TABS: 250.0000 mg | ORAL_TABLET | ORAL | 0 refills | Status: DC

## 2020-09-29 NOTE — Telephone Encounter (Signed)
Attempted to contact patient; no voicemail set up at this time

## 2020-09-29 NOTE — Telephone Encounter (Signed)
Discussed with pt. Pt verbalized understanding. Med sent to pharm.  

## 2020-10-07 ENCOUNTER — Other Ambulatory Visit: Payer: Self-pay

## 2020-10-07 ENCOUNTER — Ambulatory Visit (HOSPITAL_COMMUNITY)
Admission: RE | Admit: 2020-10-07 | Discharge: 2020-10-07 | Disposition: A | Discharge status: Home / Self Care | Payer: Medicare Other | Source: Ambulatory Visit | Attending: Family Medicine | Admitting: Family Medicine

## 2020-10-07 DIAGNOSIS — Z136 Encounter for screening for cardiovascular disorders: Secondary | ICD-10-CM | POA: Diagnosis not present

## 2020-10-07 DIAGNOSIS — F17210 Nicotine dependence, cigarettes, uncomplicated: Secondary | ICD-10-CM | POA: Insufficient documentation

## 2020-10-11 ENCOUNTER — Encounter: Payer: Self-pay | Admitting: Family Medicine

## 2020-10-11 DIAGNOSIS — I719 Aortic aneurysm of unspecified site, without rupture: Secondary | ICD-10-CM

## 2020-10-11 HISTORY — DX: Aortic aneurysm of unspecified site, without rupture: I71.9

## 2020-11-25 ENCOUNTER — Other Ambulatory Visit: Payer: Self-pay | Admitting: Family Medicine

## 2021-02-13 ENCOUNTER — Ambulatory Visit: Payer: Medicare Other | Admitting: Family Medicine

## 2021-02-15 ENCOUNTER — Encounter: Payer: Self-pay | Admitting: Family Medicine

## 2021-02-15 ENCOUNTER — Inpatient Hospital Stay (HOSPITAL_COMMUNITY): Payer: 59

## 2021-02-15 ENCOUNTER — Ambulatory Visit (INDEPENDENT_AMBULATORY_CARE_PROVIDER_SITE_OTHER): Payer: Medicare Other | Admitting: Family Medicine

## 2021-02-15 ENCOUNTER — Other Ambulatory Visit: Payer: Self-pay

## 2021-02-15 VITALS — BP 119/63 | HR 59 | Temp 97.2°F | Ht 69.0 in | Wt 189.0 lb

## 2021-02-15 DIAGNOSIS — Z23 Encounter for immunization: Secondary | ICD-10-CM | POA: Diagnosis not present

## 2021-02-15 DIAGNOSIS — R197 Diarrhea, unspecified: Secondary | ICD-10-CM

## 2021-02-15 DIAGNOSIS — I714 Abdominal aortic aneurysm, without rupture, unspecified: Secondary | ICD-10-CM

## 2021-02-15 NOTE — Progress Notes (Signed)
   Subjective:    Patient ID: Brandon Vega, male    DOB: [birth year], 70 y.o.   MRN: [id number]  Diarrhea   X 2 months x 3 daily on and off  Denies fever or chills Denies vomiting.  Denies mucousy or bloody stools Has had frequent diarrhea.  Frequent loose stools Foul odor Some abdominal cramping Imodium/ pepto   [place] trip in June    Review of Systems  Gastrointestinal:  Positive for diarrhea.      Objective:   Physical Exam  Lungs are clear hearts regular abdomen soft no guarding rebound or tenderness  Not toxic    Assessment & Plan:  Has dilation of the aorta.  Recommended to do a follow-up ultrasound in 2 years time.  This is not causing his trouble currently  Diarrhea with recent trip to [place] stool studies recommended if this is unrevealing and patient still having symptoms referral to GI.

## 2021-02-18 LAB — HEPATIC FUNCTION PANEL
ALT: 19 IU/L (ref 0–44)
AST: 29 IU/L (ref 0–40)
Albumin: 4.5 g/dL (ref 3.7–4.7)
Alkaline Phosphatase: 68 IU/L (ref 44–121)
Bilirubin Total: 1 mg/dL (ref 0.0–1.2)
Bilirubin, Direct: 0.23 mg/dL (ref 0.00–0.40)
Total Protein: 6.8 g/dL (ref 6.0–8.5)

## 2021-02-18 LAB — BASIC METABOLIC PANEL
BUN/Creatinine Ratio: 10 (ref 10–24)
BUN: 10 mg/dL (ref 8–27)
CO2: 21 mmol/L (ref 20–29)
Calcium: 9.6 mg/dL (ref 8.6–10.2)
Chloride: 105 mmol/L (ref 96–106)
Creatinine, Ser: 0.98 mg/dL (ref 0.76–1.27)
Glucose: 96 mg/dL (ref 70–99)
Potassium: 4.3 mmol/L (ref 3.5–5.2)
Sodium: 141 mmol/L (ref 134–144)
eGFR: 82 mL/min/{1.73_m2} (ref >59)

## 2021-02-18 LAB — CBC WITH DIFFERENTIAL/PLATELET
Basophils Absolute: 0.1 10*3/uL (ref 0.0–0.2)
Basos: 1 %
EOS (ABSOLUTE): 0.3 10*3/uL (ref 0.0–0.4)
Eos: 3 %
Hematocrit: 44 % (ref 37.5–51.0)
Hemoglobin: 14.4 g/dL (ref 13.0–17.7)
Immature Grans (Abs): 0 10*3/uL (ref 0.0–0.1)
Immature Granulocytes: 0 %
Lymphocytes Absolute: 5 10*3/uL — ABNORMAL HIGH (ref 0.7–3.1)
Lymphs: 46 %
MCH: 29.8 pg (ref 26.6–33.0)
MCHC: 32.7 g/dL (ref 31.5–35.7)
MCV: 91 fL (ref 79–97)
Monocytes Absolute: 0.7 10*3/uL (ref 0.1–0.9)
Monocytes: 7 %
Neutrophils Absolute: 4.5 10*3/uL (ref 1.4–7.0)
Neutrophils: 43 %
Platelets: 255 10*3/uL (ref 150–450)
RBC: 4.83 x10E6/uL (ref 4.14–5.80)
RDW: 12.6 % (ref 11.6–15.4)
WBC: 10.6 10*3/uL (ref 3.4–10.8)

## 2021-02-20 ENCOUNTER — Other Ambulatory Visit: Payer: Self-pay | Admitting: Family Medicine

## 2021-02-21 LAB — C DIFFICILE TOXINS A+B W/RFLX: C difficile Toxins A+B, EIA: NEGATIVE

## 2021-02-21 LAB — C DIFFICILE, CYTOTOXIN B

## 2021-02-22 ENCOUNTER — Ambulatory Visit (HOSPITAL_COMMUNITY): Payer: 59 | Admitting: Hematology

## 2021-02-22 LAB — STOOL CULTURE: E coli, Shiga toxin Assay: NEGATIVE

## 2021-02-22 LAB — GIARDIA/CRYPTOSPORIDIUM EIA
Cryptosporidium EIA: NEGATIVE
Giardia Ag, Stl: NEGATIVE

## 2021-02-23 ENCOUNTER — Other Ambulatory Visit: Payer: Self-pay | Admitting: Family Medicine

## 2021-02-23 LAB — OVA AND PARASITE EXAMINATION

## 2021-04-26 ENCOUNTER — Inpatient Hospital Stay (HOSPITAL_COMMUNITY): Payer: Medicare Other | Attending: Hematology

## 2021-04-26 DIAGNOSIS — C9110 Chronic lymphocytic leukemia of B-cell type not having achieved remission: Secondary | ICD-10-CM | POA: Diagnosis present

## 2021-04-26 LAB — CBC WITH DIFFERENTIAL/PLATELET
Abs Immature Granulocytes: 0.04 10*3/uL (ref 0.00–0.07)
Basophils Absolute: 0.1 10*3/uL (ref 0.0–0.1)
Basophils Relative: 1 %
Eosinophils Absolute: 0.2 10*3/uL (ref 0.0–0.5)
Eosinophils Relative: 2 %
HCT: 46.6 % (ref 39.0–52.0)
Hemoglobin: 15 g/dL (ref 13.0–17.0)
Immature Granulocytes: 0 %
Lymphocytes Relative: 42 %
Lymphs Abs: 5.3 10*3/uL — ABNORMAL HIGH (ref 0.7–4.0)
MCH: 31.6 pg (ref 26.0–34.0)
MCHC: 32.2 g/dL (ref 30.0–36.0)
MCV: 98.1 fL (ref 80.0–100.0)
Monocytes Absolute: 0.8 10*3/uL (ref 0.1–1.0)
Monocytes Relative: 6 %
Neutro Abs: 6 10*3/uL (ref 1.7–7.7)
Neutrophils Relative %: 49 %
Platelets: 240 10*3/uL (ref 150–400)
RBC: 4.75 MIL/uL (ref 4.22–5.81)
RDW: 12.9 % (ref 11.5–15.5)
WBC: 12.5 10*3/uL — ABNORMAL HIGH (ref 4.0–10.5)
nRBC: 0 % (ref 0.0–0.2)

## 2021-04-26 LAB — COMPREHENSIVE METABOLIC PANEL
ALT: 22 U/L (ref 0–44)
AST: 29 U/L (ref 15–41)
Albumin: 4.2 g/dL (ref 3.5–5.0)
Alkaline Phosphatase: 60 U/L (ref 38–126)
Anion gap: 8 (ref 5–15)
BUN: 14 mg/dL (ref 8–23)
CO2: 27 mmol/L (ref 22–32)
Calcium: 9.4 mg/dL (ref 8.9–10.3)
Chloride: 102 mmol/L (ref 98–111)
Creatinine, Ser: 0.97 mg/dL (ref 0.61–1.24)
GFR, Estimated: >60 mL/min (ref >60)
Glucose, Bld: 86 mg/dL (ref 70–99)
Potassium: 4.1 mmol/L (ref 3.5–5.1)
Sodium: 137 mmol/L (ref 135–145)
Total Bilirubin: 1.8 mg/dL — ABNORMAL HIGH (ref 0.3–1.2)
Total Protein: 7.7 g/dL (ref 6.5–8.1)

## 2021-04-26 LAB — LACTATE DEHYDROGENASE: LDH: 146 U/L (ref 98–192)

## 2021-05-02 NOTE — Progress Notes (Signed)
[place], [place] [address]   CLINIC:  Medical Oncology/Hematology  PCP:  Kathyrn Drown, MD [address] [place] / [place] [address]  [phone]  REASON FOR VISIT:  Follow-up for CLL  PRIOR THERAPY: none  CURRENT THERAPY: surveillance  INTERVAL HISTORY:  Mr. RIGOBERTO REPASS, a 70 y.o. 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 Gene was last seen on 08/10/2020.  Today he reports feeling good. He reports mild diarrhea following his trip to [place] in June. He denies fevers and night sweats. He reports back pain over the past 2 weeks.   REVIEW OF SYSTEMS:  Review of Systems  Constitutional:  Negative for appetite change and fatigue (70%).  Gastrointestinal:  Positive for diarrhea.  Musculoskeletal:  Positive for back pain (5/10).  Neurological:  Positive for dizziness and numbness.  All other systems reviewed and are negative.  PAST MEDICAL/SURGICAL HISTORY:  Past Medical History:  Diagnosis Date   Allergy    Aortic aneurysm ([place]) 10/11/2020   On ultrasound 3.6 cm May 2020 2 repeat again in approximately 20 months.  Patient advised to quit smoking.   ED (erectile dysfunction)    Past Surgical History:  Procedure Laterality Date   COLONOSCOPY N/A 12/26/2017   Procedure: COLONOSCOPY;  Surgeon: Rogene Houston, MD;  Location: [facility];  Service: Endoscopy;  Laterality: N/A;  930   VASECTOMY      SOCIAL HISTORY:  Social History   Socioeconomic History   Marital status: Married    Spouse name: Not on file   Number of children: Not on file   Years of education: Not on file   Highest education level: Not on file  Occupational History   Not on file  Tobacco Use   Smoking status: Every Day    Packs/day: 1.50    Types: Pipe, Cigarettes   Smokeless tobacco: Never  Substance and Sexual Activity   Alcohol use: Yes    Alcohol/week: 1.0 standard drink    Types: 1 Glasses of wine per week   Drug use: Never    Sexual activity: Not on file  Other Topics Concern   Not on file  Social History Narrative   Not on file   Social Determinants of Health   Financial Resource Strain: Not on file  Food Insecurity: Not on file  Transportation Needs: Not on file  Physical Activity: Not on file  Stress: Not on file  Social Connections: Not on file  Intimate Partner Violence: Not on file    FAMILY HISTORY:  Family History  Problem Relation Age of Onset   Cancer Mother        throat   Hypertension Father    Diabetes Father    Hyperlipidemia Father    Cancer Father        lung   Cancer Other        breast   Heart attack Other     CURRENT MEDICATIONS:  Current Outpatient Medications  Medication Sig Dispense Refill   amLODipine (NORVASC) 5 MG tablet TAKE 1 TABLET(5 MG) BY MOUTH AT BEDTIME 90 tablet 0   aspirin EC 81 MG tablet Take 81 mg by mouth daily.     mefloquine (LARIAM) 250 MG tablet Take 1 tablet (250 mg total) by mouth every 7 (seven) days. Start 2 weeks before trip,  take one weekly while on trip, and take one weekly for additional 4 weeks after returning home from trip.  8 tablet 0   Misc Natural Products (PROSTATE HEALTH PO) Prostate Health     Misc Natural Products (PROSTATE HEALTH PO) Take by mouth.     OVER THE COUNTER MEDICATION neugenix     predniSONE (DELTASONE) 10 MG tablet Take 4 tab p.o. x 2 days, then 3 tab x 2 day, then 2 tab x 2 days, then 1 tab x 2 day. 20 tablet 0   No current facility-administered medications for this visit.    ALLERGIES:  Allergies  Allergen Reactions   Ace Inhibitors     cough    PHYSICAL EXAM:  Performance status (ECOG): 1 - Symptomatic but completely ambulatory  Vitals:   05/03/21 1141  BP: 125/66  Pulse: 96  Resp: 17  Temp: (!) 97.3 F (36.3 C)  SpO2: 99%   Wt Readings from Last 3 Encounters:  05/03/21 188 lb 1.6 oz (85.3 kg)  02/15/21 189 lb (85.7 kg)  09/28/20 192 lb (87.1 kg)   Physical Exam Vitals reviewed.   Constitutional:      Appearance: Normal appearance.  Cardiovascular:     Rate and Rhythm: Normal rate and regular rhythm.     Pulses: Normal pulses.     Heart sounds: Normal heart sounds.  Pulmonary:     Effort: Pulmonary effort is normal.     Breath sounds: Normal breath sounds.  Abdominal:     Palpations: Abdomen is soft. There is no hepatomegaly, splenomegaly or mass.     Tenderness: There is no abdominal tenderness.  Musculoskeletal:     Right lower leg: No edema.     Left lower leg: No edema.  Lymphadenopathy:     Cervical: No cervical adenopathy.     Right cervical: No superficial cervical adenopathy.    Left cervical: No superficial cervical adenopathy.     Upper Body:     Right upper body: No supraclavicular, axillary or pectoral adenopathy.     Left upper body: No supraclavicular, axillary or pectoral adenopathy.     Lower Body: No right inguinal adenopathy. No left inguinal adenopathy.  Neurological:     General: No focal deficit present.     Mental Status: He is alert and oriented to person, place, and time.  Psychiatric:        Mood and Affect: Mood normal.        Behavior: Behavior normal.    LABORATORY DATA:  I have reviewed the labs as listed.  CBC Latest Ref Rng & Units 04/26/2021 02/17/2021 08/03/2020  WBC 4.0 - 10.5 K/uL 12.5(H) 10.6 11.5(H)  Hemoglobin 13.0 - 17.0 g/dL 15.0 14.4 15.0  Hematocrit 39.0 - 52.0 % 46.6 44.0 46.2  Platelets 150 - 400 K/uL 240 255 241   CMP Latest Ref Rng & Units 04/26/2021 02/17/2021 09/21/2020  Glucose 70 - 99 mg/dL 86 96 92  BUN 8 - 23 mg/dL 14 10 16   Creatinine 0.61 - 1.24 mg/dL 0.97 0.98 1.07  Sodium 135 - 145 mmol/L 137 141 141  Potassium 3.5 - 5.1 mmol/L 4.1 4.3 4.5  Chloride 98 - 111 mmol/L 102 105 103  CO2 22 - 32 mmol/L 27 21 24   Calcium 8.9 - 10.3 mg/dL 9.4 9.6 9.8  Total Protein 6.5 - 8.1 g/dL 7.7 6.8 7.2  Total Bilirubin 0.3 - 1.2 mg/dL 1.8(H) 1.0 1.4(H)  Alkaline Phos 38 - 126 U/L 60 68 77  AST 15 - 41 U/L 29  29 27   ALT 0 - 44 U/L 22 19 19  Component Value Date/Time   RBC 4.75 04/26/2021 1102   MCV 98.1 04/26/2021 1102   MCV 91 02/17/2021 0952   MCH 31.6 04/26/2021 1102   MCHC 32.2 04/26/2021 1102   RDW 12.9 04/26/2021 1102   RDW 12.6 02/17/2021 0952   LYMPHSABS 5.3 (H) 04/26/2021 1102   LYMPHSABS 5.0 (H) 02/17/2021 0952   MONOABS 0.8 04/26/2021 1102   EOSABS 0.2 04/26/2021 1102   EOSABS 0.3 02/17/2021 0952   BASOSABS 0.1 04/26/2021 1102   BASOSABS 0.1 02/17/2021 0952    DIAGNOSTIC IMAGING:  I have independently reviewed the scans and discussed with the patient. No results found.   ASSESSMENT:  1.  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 -Denies any fevers, night sweats or weight loss.  Denies any recurrent infections or hospitalizations. -Peripheral blood flow cytometry from 01/08/2019 showed CD5 positive monoclonal B-cell population, consistent with CLL.   2.  Health maintenance: -Colonoscopy on 12/26/2016 shows diverticulosis of the sigmoid colon external hemorrhoids.   3.  Smoking history: -Does not smoke cigarettes.  However smokes pipe every day for past 30+ years.   PLAN:  1.  Stage 0 CLL: - He denies any fevers, night sweats or weight loss in the last 6 months. - He had a missionary trip to [place] in June.  He had some diarrhea, which was investigated by Dr. Wolfgang Phoenix and was found to be parasite negative. - He does not report any infections in the past 6 months. - Reviewed his labs from 04/26/2021.  White count is 12.5 with normal hemoglobin and platelets.  Chemistries were near normal. - Physical examination did not reveal any palpable adenopathy or splenomegaly. - In the absence of symptoms, we will continue to monitor at 6 monthly intervals.  No indication for treatment at this time.  Orders placed this encounter:  No orders of the defined types were placed in this encounter.    Derek Jack, MD [place] [phone]   I, Thana Ates, am acting as a scribe for Dr. Derek Jack.  I, Derek Jack MD, have reviewed the above documentation for accuracy and completeness, and I agree with the above.

## 2021-05-03 ENCOUNTER — Other Ambulatory Visit: Payer: Self-pay

## 2021-05-03 ENCOUNTER — Inpatient Hospital Stay (HOSPITAL_BASED_OUTPATIENT_CLINIC_OR_DEPARTMENT_OTHER): Payer: Medicare Other | Admitting: Hematology

## 2021-05-03 VITALS — BP 125/66 | HR 96 | Temp 97.3°F | Resp 17 | Wt 188.1 lb

## 2021-05-03 DIAGNOSIS — C9110 Chronic lymphocytic leukemia of B-cell type not having achieved remission: Secondary | ICD-10-CM

## 2021-05-18 ENCOUNTER — Other Ambulatory Visit: Payer: Self-pay | Admitting: Family Medicine

## 2021-06-01 ENCOUNTER — Telehealth: Payer: Self-pay | Admitting: Family Medicine

## 2021-06-01 NOTE — Telephone Encounter (Signed)
°  Left message for patient to call back and schedule Medicare Annual Wellness Visit (AWV) in office.   If unable to come into the office for AWV,  please offer to do virtually or by telephone.  No hx of AWV eligible for AWVI as of 09/11/2013 per [facility]  Please schedule at anytime with [facility].      40 Minutes appointment   Any questions, please call me at [phone]

## 2021-06-23 ENCOUNTER — Other Ambulatory Visit: Payer: Self-pay

## 2021-06-23 ENCOUNTER — Encounter: Payer: Self-pay | Admitting: Nurse Practitioner

## 2021-06-23 ENCOUNTER — Ambulatory Visit (INDEPENDENT_AMBULATORY_CARE_PROVIDER_SITE_OTHER): Payer: Medicare Other | Admitting: Nurse Practitioner

## 2021-06-23 VITALS — Temp 98.2°F | Wt 192.6 lb

## 2021-06-23 DIAGNOSIS — J029 Acute pharyngitis, unspecified: Secondary | ICD-10-CM | POA: Diagnosis not present

## 2021-06-23 DIAGNOSIS — J069 Acute upper respiratory infection, unspecified: Secondary | ICD-10-CM | POA: Diagnosis not present

## 2021-06-23 DIAGNOSIS — U071 COVID-19: Secondary | ICD-10-CM | POA: Diagnosis not present

## 2021-06-23 LAB — POCT RAPID STREP A (OFFICE): Rapid Strep A Screen: NEGATIVE

## 2021-06-23 NOTE — Progress Notes (Signed)
° °  Subjective:    Patient ID: Brandon Vega, male    DOB: 11-Nov-1950, 71 y.o.   MRN: [id number]  HPI Pt tested positive today for COVID. Having head cold symptoms (cough at times, runny nose per pt). Has been using OTC treatment.  Symptoms began on 06/14/21. COVID tested positive today. His wife and daughter are also sick. Recent flight to [place] to visit his daughter. No fever. Describes his symptoms as a "head cold". No change in smell or taste. Has PND with slight cough at night. Taking Coricidin and Nyquil for his symptoms.   Review of Systems  Constitutional:  Negative for fever.  HENT:  Negative for ear pain and sore throat.   Respiratory:  Positive for cough. Negative for chest tightness, shortness of breath and wheezing.   Cardiovascular:  Negative for chest pain.      Objective:   Physical Exam NAD. Alert, oriented. TMs mild clear effusion, no erythema. Pharynx moist, mild erythema with PND noted. RST neg. Lungs clear. Heart RRR.  Today's Vitals   06/23/21 1604  Temp: 98.2 F (36.8 C)  Weight: 192 lb 9.6 oz (87.4 kg)   Body mass index is 28.44 kg/m.       Assessment & Plan:  Positive self-administered antigen test for COVID-19 - Plan: COVID-19, Flu A+B and RSV  Acute pharyngitis, unspecified etiology - Plan: COVID-19, Flu A+B and RSV, POCT rapid strep A, Culture, Group A Strep  URI, acute - Plan: COVID-19, Flu A+B and RSV  Patient requesting our testing for COVID. Continue symptomatic care. Discussed warning signs. Given written and verbal information on CDC isolation for positive COVID testing.  Call back if no improvement next week, seek help sooner if worse.

## 2021-06-23 NOTE — Patient Instructions (Signed)
COVID-19: Quarantine and Isolation °Quarantine °If you were exposed °Quarantine and stay away from others when you have been in close contact with someone who has COVID-19. °Isolate °If you are sick or test positive °Isolate when you are sick or when you have COVID-19, even if you don't have symptoms. °When to stay home °Calculating quarantine °The date of your exposure is considered day 0. Day 1 is the first full day after your last contact with a person who has had COVID-19. Stay home and away from other people for at least 5 days. Learn why CDC updated guidance for the general public. °IF YOU were exposed to COVID-19 and are NOT  °up to dateIF YOU were exposed to COVID-19 and are NOT on COVID-19 vaccinations °Quarantine for at least 5 days °Stay home °Stay home and quarantine for at least 5 full days. °Wear a well-fitting mask if you must be around others in your home. °Do not travel. °Get tested °Even if you don't develop symptoms, get tested at least 5 days after you last had close contact with someone with COVID-19. °After quarantine °Watch for symptoms °Watch for symptoms until 10 days after you last had close contact with someone with COVID-19. °Avoid travel °It is best to avoid travel until a full 10 days after you last had close contact with someone with COVID-19. °If you develop symptoms °Isolate immediately and get tested. Continue to stay home until you know the results. Wear a well-fitting mask around others. °Take precautions until day 10 °Wear a well-fitting mask °Wear a well-fitting mask for 10 full days any time you are around others inside your home or in public. Do not go to places where you are unable to wear a well-fitting mask. °If you must travel during days 6-10, take precautions. °Avoid being around people who are more likely to get very sick from COVID-19. °IF YOU were exposed to COVID-19 and are  °up to dateIF YOU were exposed to COVID-19 and are on COVID-19 vaccinations °No  quarantine °You do not need to stay home unless you develop symptoms. °Get tested °Even if you don't develop symptoms, get tested at least 5 days after you last had close contact with someone with COVID-19. °Watch for symptoms °Watch for symptoms until 10 days after you last had close contact with someone with COVID-19. °If you develop symptoms °Isolate immediately and get tested. Continue to stay home until you know the results. Wear a well-fitting mask around others. °Take precautions until day 10 °Wear a well-fitting mask °Wear a well-fitting mask for 10 full days any time you are around others inside your home or in public. Do not go to places where you are unable to wear a well-fitting mask. °Take precautions if traveling °Avoid being around people who are more likely to get very sick from COVID-19. °IF YOU were exposed to COVID-19 and had confirmed COVID-19 within the past 90 days (you tested positive using a viral test) °No quarantine °You do not need to stay home unless you develop symptoms. °Watch for symptoms °Watch for symptoms until 10 days after you last had close contact with someone with COVID-19. °If you develop symptoms °Isolate immediately and get tested. Continue to stay home until you know the results. Wear a well-fitting mask around others. °Take precautions until day 10 °Wear a well-fitting mask °Wear a well-fitting mask for 10 full days any time you are around others inside your home or in public. Do not go to places where you are   unable to wear a well-fitting mask. °Take precautions if traveling °Avoid being around people who are more likely to get very sick from COVID-19. °Calculating isolation °Day 0 is your first day of symptoms or a positive viral test. Day 1 is the first full day after your symptoms developed or your test specimen was collected. If you have COVID-19 or have symptoms, isolate for at least 5 days. °IF YOU tested positive for COVID-19 or have symptoms, regardless of  vaccination status °Stay home for at least 5 days °Stay home for 5 days and isolate from others in your home. °Wear a well-fitting mask if you must be around others in your home. °Do not travel. °Ending isolation if you had symptoms °End isolation after 5 full days if you are fever-free for 24 hours (without the use of fever-reducing medication) and your symptoms are improving. °Ending isolation if you did NOT have symptoms °End isolation after at least 5 full days after your positive test. °If you got very sick from COVID-19 or have a weakened immune system °You should isolate for at least 10 days. Consult your doctor before ending isolation. °Take precautions until day 10 °Wear a well-fitting mask °Wear a well-fitting mask for 10 full days any time you are around others inside your home or in public. Do not go to places where you are unable to wear a well-fitting mask. °Do not travel °Do not travel until a full 10 days after your symptoms started or the date your positive test was taken if you had no symptoms. °Avoid being around people who are more likely to get very sick from COVID-19. °Definitions °Exposure °Contact with someone infected with SARS-CoV-2, the virus that causes COVID-19, in a way that increases the likelihood of getting infected with the virus. °Close contact °A close contact is someone who was less than 6 feet away from an infected person (laboratory-confirmed or a clinical diagnosis) for a cumulative total of 15 minutes or more over a 24-hour period. For example, three individual 5-minute exposures for a total of 15 minutes. People who are exposed to someone with COVID-19 after they completed at least 5 days of isolation are not considered close contacts. °Quarantine °Quarantine is a strategy used to prevent transmission of COVID-19 by keeping people who have been in close contact with someone with COVID-19 apart from others. °Who does not need to quarantine? °If you had close contact with  someone with COVID-19 and you are in one of the following groups, you do not need to quarantine. °You are up to date with your COVID-19 vaccines. °You had confirmed COVID-19 within the last 90 days (meaning you tested positive using a viral test). °If you are up to date with COVID-19 vaccines, you should wear a well-fitting mask around others for 10 days from the date of your last close contact with someone with COVID-19 (the date of last close contact is considered day 0). Get tested at least 5 days after you last had close contact with someone with COVID-19. If you test positive or develop COVID-19 symptoms, isolate from other people and follow recommendations in the Isolation section below. If you tested positive for COVID-19 with a viral test within the previous 90 days and subsequently recovered and remain without COVID-19 symptoms, you do not need to quarantine or get tested after close contact. You should wear a well-fitting mask around others for 10 days from the date of your last close contact with someone with COVID-19 (the date of last   close contact is considered day 0). If you have COVID-19 symptoms, get tested and isolate from other people and follow recommendations in the Isolation section below. °Who should quarantine? °If you come into close contact with someone with COVID-19, you should quarantine if you are not up to date on COVID-19 vaccines. This includes people who are not vaccinated. °What to do for quarantine °Stay home and away from other people for at least 5 days (day 0 through day 5) after your last contact with a person who has COVID-19. The date of your exposure is considered day 0. Wear a well-fitting mask when around others at home, if possible. °For 10 days after your last close contact with someone with COVID-19, watch for fever (100.4°F or greater), cough, shortness of breath, or other COVID-19 symptoms. °If you develop symptoms, get tested immediately and isolate until you receive  your test results. If you test positive, follow isolation recommendations. °If you do not develop symptoms, get tested at least 5 days after you last had close contact with someone with COVID-19. °If you test negative, you can leave your home, but continue to wear a well-fitting mask when around others at home and in public until 10 days after your last close contact with someone with COVID-19. °If you test positive, you should isolate for at least 5 days from the date of your positive test (if you do not have symptoms). If you do develop COVID-19 symptoms, isolate for at least 5 days from the date your symptoms began (the date the symptoms started is day 0). Follow recommendations in the isolation section below. °If you are unable to get a test 5 days after last close contact with someone with COVID-19, you can leave your home after day 5 if you have been without COVID-19 symptoms throughout the 5-day period. Wear a well-fitting mask for 10 days after your date of last close contact when around others at home and in public. °Avoid people who are have weakened immune systems or are more likely to get very sick from COVID-19, and nursing homes and other high-risk settings, until after at least 10 days. °If possible, stay away from people you live with, especially people who are at higher risk for getting very sick from COVID-19, as well as others outside your home throughout the full 10 days after your last close contact with someone with COVID-19. °If you are unable to quarantine, you should wear a well-fitting mask for 10 days when around others at home and in public. °If you are unable to wear a mask when around others, you should continue to quarantine for 10 days. Avoid people who have weakened immune systems or are more likely to get very sick from COVID-19, and nursing homes and other high-risk settings, until after at least 10 days. °See additional information about travel. °Do not go to places where you are  unable to wear a mask, such as restaurants and some gyms, and avoid eating around others at home and at work until after 10 days after your last close contact with someone with COVID-19. °After quarantine °Watch for symptoms until 10 days after your last close contact with someone with COVID-19. °If you have symptoms, isolate immediately and get tested. °Quarantine in high-risk congregate settings °In certain congregate settings that have high risk of secondary transmission (such as correctional and detention facilities, homeless shelters, or cruise ships), CDC recommends a 10-day quarantine for residents, regardless of vaccination and booster status. During periods of critical staffing   shortages, facilities may consider shortening the quarantine period for staff to ensure continuity of operations. Decisions to shorten quarantine in these settings should be made in consultation with state, local, tribal, or territorial health departments and should take into consideration the context and characteristics of the facility. CDC's setting-specific guidance provides additional recommendations for these settings. °Isolation °Isolation is used to separate people with confirmed or suspected COVID-19 from those without COVID-19. People who are in isolation should stay home until it's safe for them to be around others. At home, anyone sick or infected should separate from others, or wear a well-fitting mask when they need to be around others. People in isolation should stay in a specific "sick room" or area and use a separate bathroom if available. Everyone who has presumed or confirmed COVID-19 should stay home and isolate from other people for at least 5 full days (day 0 is the first day of symptoms or the date of the day of the positive viral test for asymptomatic persons). They should wear a mask when around others at home and in public for an additional 5 days. People who are confirmed to have COVID-19 or are showing  symptoms of COVID-19 need to isolate regardless of their vaccination status. This includes: °People who have a positive viral test for COVID-19, regardless of whether or not they have symptoms. °People with symptoms of COVID-19, including people who are awaiting test results or have not been tested. People with symptoms should isolate even if they do not know if they have been in close contact with someone with COVID-19. °What to do for isolation °Monitor your symptoms. If you have an emergency warning sign (including trouble breathing), seek emergency medical care immediately. °Stay in a separate room from other household members, if possible. °Use a separate bathroom, if possible. °Take steps to improve ventilation at home, if possible. °Avoid contact with other members of the household and pets. °Don't share personal household items, like cups, towels, and utensils. °Wear a well-fitting mask when you need to be around other people. °Learn more about what to do if you are sick and how to notify your contacts. °Ending isolation for people who had COVID-19 and had symptoms °If you had COVID-19 and had symptoms, isolate for at least 5 days. To calculate your 5-day isolation period, day 0 is your first day of symptoms. Day 1 is the first full day after your symptoms developed. You can leave isolation after 5 full days. °You can end isolation after 5 full days if you are fever-free for 24 hours without the use of fever-reducing medication and your other symptoms have improved (Loss of taste and smell may persist for weeks or months after recovery and need not delay the end of isolation). °You should continue to wear a well-fitting mask around others at home and in public for 5 additional days (day 6 through day 10) after the end of your 5-day isolation period. If you are unable to wear a mask when around others, you should continue to isolate for a full 10 days. Avoid people who have weakened immune systems or are more  likely to get very sick from COVID-19, and nursing homes and other high-risk settings, until after at least 10 days. °If you continue to have fever or your other symptoms have not improved after 5 days of isolation, you should wait to end your isolation until you are fever-free for 24 hours without the use of fever-reducing medication and your other symptoms have improved.   Continue to wear a well-fitting mask through day 10. Contact your healthcare provider if you have questions. °See additional information about travel. °Do not go to places where you are unable to wear a mask, such as restaurants and some gyms, and avoid eating around others at home and at work until a full 10 days after your first day of symptoms. °If an individual has access to a test and wants to test, the best approach is to use an antigen test1 towards the end of the 5-day isolation period. Collect the test sample only if you are fever-free for 24 hours without the use of fever-reducing medication and your other symptoms have improved (loss of taste and smell may persist for weeks or months after recovery and need not delay the end of isolation). If your test result is positive, you should continue to isolate until day 10. If your test result is negative, you can end isolation, but continue to wear a well-fitting mask around others at home and in public until day 10. Follow additional recommendations for masking and avoiding travel as described above. °1As noted in the labeling for authorized over-the counter antigen tests: Negative results should be treated as presumptive. Negative results do not rule out SARS-CoV-2 infection and should not be used as the sole basis for treatment or patient management decisions, including infection control decisions. To improve results, antigen tests should be used twice over a three-day period with at least 24 hours and no more than 48 hours between tests. °Note that these recommendations on ending isolation  do not apply to people who are moderately ill or very sick from COVID-19 or have weakened immune systems. See section below for recommendations for when to end isolation for these groups. °Ending isolation for people who tested positive for COVID-19 but had no symptoms °If you test positive for COVID-19 and never develop symptoms, isolate for at least 5 days. Day 0 is the day of your positive viral test (based on the date you were tested) and day 1 is the first full day after the specimen was collected for your positive test. You can leave isolation after 5 full days. °If you continue to have no symptoms, you can end isolation after at least 5 days. °You should continue to wear a well-fitting mask around others at home and in public until day 10 (day 6 through day 10). If you are unable to wear a mask when around others, you should continue to isolate for 10 days. Avoid people who have weakened immune systems or are more likely to get very sick from COVID-19, and nursing homes and other high-risk settings, until after at least 10 days. °If you develop symptoms after testing positive, your 5-day isolation period should start over. Day 0 is your first day of symptoms. Follow the recommendations above for ending isolation for people who had COVID-19 and had symptoms. °See additional information about travel. °Do not go to places where you are unable to wear a mask, such as restaurants and some gyms, and avoid eating around others at home and at work until 10 days after the day of your positive test. °If an individual has access to a test and wants to test, the best approach is to use an antigen test1 towards the end of the 5-day isolation period. If your test result is positive, you should continue to isolate until day 10. If your test result is positive, you can also choose to test daily and if your test result   is negative, you can end isolation, but continue to wear a well-fitting mask around others at home and in  public until day 10. Follow additional recommendations for masking and avoiding travel as described above. °1As noted in the labeling for authorized over-the counter antigen tests: Negative results should be treated as presumptive. Negative results do not rule out SARS-CoV-2 infection and should not be used as the sole basis for treatment or patient management decisions, including infection control decisions. To improve results, antigen tests should be used twice over a three-day period with at least 24 hours and no more than 48 hours between tests. °Ending isolation for people who were moderately or very sick from COVID-19 or have a weakened immune system °People who are moderately ill from COVID-19 (experiencing symptoms that affect the lungs like shortness of breath or difficulty breathing) should isolate for 10 days and follow all other isolation precautions. To calculate your 10-day isolation period, day 0 is your first day of symptoms. Day 1 is the first full day after your symptoms developed. If you are unsure if your symptoms are moderate, talk to a healthcare provider for further guidance. °People who are very sick from COVID-19 (this means people who were hospitalized or required intensive care or ventilation support) and people who have weakened immune systems might need to isolate at home longer. They may also require testing with a viral test to determine when they can be around others. CDC recommends an isolation period of at least 10 and up to 20 days for people who were very sick from COVID-19 and for people with weakened immune systems. Consult with your healthcare provider about when you can resume being around other people. If you are unsure if your symptoms are severe or if you have a weakened immune system, talk to a healthcare provider for further guidance. °People who have a weakened immune system should talk to their healthcare provider about the potential for reduced immune responses to  COVID-19 vaccines and the need to continue to follow current prevention measures (including wearing a well-fitting mask and avoiding crowds and poorly ventilated indoor spaces) to protect themselves against COVID-19 until advised otherwise by their healthcare provider. Close contacts of immunocompromised people--including household members--should also be encouraged to receive all recommended COVID-19 vaccine doses to help protect these people. °Isolation in high-risk congregate settings °In certain high-risk congregate settings that have high risk of secondary transmission and where it is not feasible to cohort people (such as correctional and detention facilities, homeless shelters, and cruise ships), CDC recommends a 10-day isolation period for residents. During periods of critical staffing shortages, facilities may consider shortening the isolation period for staff to ensure continuity of operations. Decisions to shorten isolation in these settings should be made in consultation with state, local, tribal, or territorial health departments and should take into consideration the context and characteristics of the facility. CDC's setting-specific guidance provides additional recommendations for these settings. °This CDC guidance is meant to supplement--not replace--any federal, state, local, territorial, or tribal health and safety laws, rules, and regulations. °Recommendations for specific settings °These recommendations do not apply to healthcare professionals. For guidance specific to these settings, see °Healthcare professionals: Interim Guidance for Managing Healthcare Personnel with SARS-CoV-2 Infection or Exposure to SARS-CoV-2 °Patients, residents, and visitors to healthcare settings: Interim Infection Prevention and Control Recommendations for Healthcare Personnel During the Coronavirus Disease 2019 (COVID-19) Pandemic °Additional setting-specific guidance and recommendations are available. °These  recommendations on quarantine and isolation do apply to K[employer]12 School   settings. Additional guidance is available here: Overview of COVID-19 Quarantine for K-12 Schools °Travelers: Travel information and recommendations °Congregate facilities and other settings: guidance pages for community, work, and school settings °Ongoing COVID-19 exposure FAQs °I live with someone with COVID-19, but I cannot be separated from them. How do we manage quarantine in this situation? °It is very important for people with COVID-19 to remain apart from other people, if possible, even if they are living together. If separation of the person with COVID-19 from others that they live with is not possible, the other people that they live with will have ongoing exposure, meaning they will be repeatedly exposed until that person is no longer able to spread the virus to other people. In this situation, there are precautions you can take to limit the spread of COVID-19: °The person with COVID-19 and everyone they live with should wear a well-fitting mask inside the home. °If possible, one person should care for the person with COVID-19 to limit the number of people who are in close contact with the infected person. °Take steps to protect yourself and others to reduce transmission in the home: °Quarantine if you are not up to date with your COVID-19 vaccines. °Isolate if you are sick or tested positive for COVID-19, even if you don't have symptoms. °Learn more about the public health recommendations for testing, mask use and quarantine of close contacts, like yourself, who have ongoing exposure. These recommendations differ depending on your vaccination status. °What should I do if I have ongoing exposure to COVID-19 from someone I live with? °Recommendations for this situation depend on your vaccination status: °If you are not up to date on COVID-19 vaccines and have ongoing exposure to COVID-19, you should: °Begin quarantine immediately and  continue to quarantine throughout the isolation period of the person with COVID-19. °Continue to quarantine for an additional 5 days starting the day after the end of isolation for the person with COVID-19. °Get tested at least 5 days after the end of isolation of the infected person that lives with them. °If you test negative, you can leave the home but should continue to wear a well-fitting mask when around others at home and in public until 10 days after the end of isolation for the person with COVID-19. °Isolate immediately if you develop symptoms of COVID-19 or test positive. °If you are up to date with COVID-19 vaccines and have ongoing exposure to COVID-19, you should: °Get tested at least 5 days after your first exposure. A person with COVID-19 is considered infectious starting 2 days before they develop symptoms, or 2 days before the date of their positive test if they do not have symptoms. °Get tested again at least 5 days after the end of isolation for the person with COVID-19. °Wear a well-fitting mask when you are around the person with COVID-19, and do this throughout their isolation period. °Wear a well-fitting mask around others for 10 days after the infected person's isolation period ends. °Isolate immediately if you develop symptoms of COVID-19 or test positive. °What should I do if multiple people I live with test positive for COVID-19 at different times? °Recommendations for this situation depend on your vaccination status: °If you are not up to date with your COVID-19 vaccines, you should: °Quarantine throughout the isolation period of any infected person that you live with. °Continue to quarantine until 5 days after the end of isolation date for the most recently infected person that lives with you. For example, if   the last day of isolation of the person most recently infected with COVID-19 was June 30, the new 5-day quarantine period starts on July 1. °Get tested at least 5 days after the end  of isolation for the most recently infected person that lives with you. °Wear a well-fitting mask when you are around any person with COVID-19 while that person is in isolation. °Wear a well-fitting mask when you are around other people until 10 days after your last close contact. °Isolate immediately if you develop symptoms of COVID-19 or test positive. °If you are up to date with your COVID-19 vaccines, you should: °Get tested at least 5 days after your first exposure. A person with COVID-19 is considered infectious starting 2 days before they developed symptoms, or 2 days before the date of their positive test if they do not have symptoms. °Get tested again at least 5 days after the end of isolation for the most recently infected person that lives with you. °Wear a well-fitting mask when you are around any person with COVID-19 while that person is in isolation. °Wear a well-fitting mask around others for 10 days after the end of isolation for the most recently infected person that lives with you. For example, if the last day of isolation for the person most recently infected with COVID-19 was June 30, the new 10-day period to wear a well-fitting mask indoors in public starts on July 1. °Isolate immediately if you develop symptoms of COVID-19 or test positive. °I had COVID-19 and completed isolation. Do I have to quarantine or get tested if someone I live with gets COVID-19 shortly after I completed isolation? °No. If you recently completed isolation and someone that lives with you tests positive for the virus that causes COVID-19 shortly after the end of your isolation period, you do not have to quarantine or get tested as long as you do not develop new symptoms. Once all of the people that live together have completed isolation or quarantine, refer to the guidance below for new exposures to COVID-19. °If you had COVID-19 in the previous 90 days and then came into close contact with someone with COVID-19, you do  not have to quarantine or get tested if you do not have symptoms. But you should: °Wear a well-fitting mask indoors in public for 10 days after your last close contact. °Monitor for COVID-19 symptoms for 10 days from the date of your last close contact. °Isolate immediately and get tested if symptoms develop. °If more than 90 days have passed since your recovery from infection, follow CDC's recommendations for close contacts. These recommendations will differ depending on your vaccination status. °08/10/2020 °Content source: National Center for Immunization and Respiratory Diseases (NCIRD), Division of Viral Diseases °This information is not intended to replace advice given to you by your health care provider. Make sure you discuss any questions you have with your health care provider. °Document Revised: 12/14/2020 Document Reviewed: 12/14/2020 °Elsevier Patient Education © 2022 [employer] Inc. ° °

## 2021-06-24 ENCOUNTER — Encounter: Payer: Self-pay | Admitting: Nurse Practitioner

## 2021-06-24 DIAGNOSIS — U071 COVID-19: Secondary | ICD-10-CM | POA: Insufficient documentation

## 2021-06-25 LAB — COVID-19, FLU A+B AND RSV
Influenza A, NAA: NOT DETECTED
Influenza B, NAA: NOT DETECTED
RSV, NAA: NOT DETECTED
SARS-CoV-2, NAA: DETECTED — AB

## 2021-06-26 ENCOUNTER — Telehealth: Payer: Self-pay | Admitting: Family Medicine

## 2021-06-26 LAB — CULTURE, GROUP A STREP: Strep A Culture: NEGATIVE

## 2021-06-26 NOTE — Telephone Encounter (Signed)
Nurses-COVID test positive Please see how patient is doing.  Winded his symptoms first began? Any chest pain shortness of breath fever chills or other issues?

## 2021-06-26 NOTE — Telephone Encounter (Signed)
I agree, he does not need to be under quarantine, positive nasal swabs can occur for weeks after infection and are not considered a sign of being contagious

## 2021-06-26 NOTE — Telephone Encounter (Signed)
Patient would like results of Covid test.

## 2021-06-26 NOTE — Telephone Encounter (Signed)
Patient stated his symptoms started 06/14/21 and he just has a little congestion- no SOB, Chest pain,  or worrisome symptoms. Patient states he is feeling fine and wanted to know if he could come out of quarantine since his test was still positive. Advised patient per CDC guidelines he could come out of quarantine since it has been 13 days since the onset of his symptoms and his symptoms are almost resolved.

## 2021-08-22 ENCOUNTER — Other Ambulatory Visit: Payer: Self-pay | Admitting: Family Medicine

## 2021-10-20 ENCOUNTER — Inpatient Hospital Stay (HOSPITAL_COMMUNITY): Payer: Medicare Other | Attending: Hematology

## 2021-10-20 DIAGNOSIS — Z8616 Personal history of COVID-19: Secondary | ICD-10-CM | POA: Insufficient documentation

## 2021-10-20 DIAGNOSIS — C9110 Chronic lymphocytic leukemia of B-cell type not having achieved remission: Secondary | ICD-10-CM | POA: Diagnosis not present

## 2021-10-20 LAB — COMPREHENSIVE METABOLIC PANEL
ALT: 19 U/L (ref 0–44)
AST: 26 U/L (ref 15–41)
Albumin: 3.8 g/dL (ref 3.5–5.0)
Alkaline Phosphatase: 55 U/L (ref 38–126)
Anion gap: 4 — ABNORMAL LOW (ref 5–15)
BUN: 9 mg/dL (ref 8–23)
CO2: 27 mmol/L (ref 22–32)
Calcium: 9 mg/dL (ref 8.9–10.3)
Chloride: 107 mmol/L (ref 98–111)
Creatinine, Ser: 0.91 mg/dL (ref 0.61–1.24)
GFR, Estimated: >60 mL/min (ref >60)
Glucose, Bld: 88 mg/dL (ref 70–99)
Potassium: 4 mmol/L (ref 3.5–5.1)
Sodium: 138 mmol/L (ref 135–145)
Total Bilirubin: 1.5 mg/dL — ABNORMAL HIGH (ref 0.3–1.2)
Total Protein: 6.9 g/dL (ref 6.5–8.1)

## 2021-10-20 LAB — CBC WITH DIFFERENTIAL/PLATELET
Abs Immature Granulocytes: 0.03 10*3/uL (ref 0.00–0.07)
Basophils Absolute: 0.1 10*3/uL (ref 0.0–0.1)
Basophils Relative: 1 %
Eosinophils Absolute: 0.3 10*3/uL (ref 0.0–0.5)
Eosinophils Relative: 3 %
HCT: 44.5 % (ref 39.0–52.0)
Hemoglobin: 14.5 g/dL (ref 13.0–17.0)
Immature Granulocytes: 0 %
Lymphocytes Relative: 46 %
Lymphs Abs: 4.9 10*3/uL — ABNORMAL HIGH (ref 0.7–4.0)
MCH: 31.3 pg (ref 26.0–34.0)
MCHC: 32.6 g/dL (ref 30.0–36.0)
MCV: 96.1 fL (ref 80.0–100.0)
Monocytes Absolute: 0.7 10*3/uL (ref 0.1–1.0)
Monocytes Relative: 6 %
Neutro Abs: 4.6 10*3/uL (ref 1.7–7.7)
Neutrophils Relative %: 44 %
Platelets: 210 10*3/uL (ref 150–400)
RBC: 4.63 MIL/uL (ref 4.22–5.81)
RDW: 12.8 % (ref 11.5–15.5)
WBC: 10.6 10*3/uL — ABNORMAL HIGH (ref 4.0–10.5)
nRBC: 0 % (ref 0.0–0.2)

## 2021-10-20 LAB — LACTATE DEHYDROGENASE: LDH: 125 U/L (ref 98–192)

## 2021-10-25 ENCOUNTER — Other Ambulatory Visit (HOSPITAL_COMMUNITY): Payer: 59

## 2021-11-01 ENCOUNTER — Inpatient Hospital Stay (HOSPITAL_BASED_OUTPATIENT_CLINIC_OR_DEPARTMENT_OTHER): Payer: Medicare Other | Admitting: Hematology

## 2021-11-01 ENCOUNTER — Encounter (HOSPITAL_COMMUNITY): Payer: Self-pay | Admitting: Hematology

## 2021-11-01 VITALS — BP 132/80 | HR 55 | Temp 97.5°F | Resp 16 | Ht 69.0 in | Wt 192.2 lb

## 2021-11-01 DIAGNOSIS — C9110 Chronic lymphocytic leukemia of B-cell type not having achieved remission: Secondary | ICD-10-CM

## 2021-11-01 DIAGNOSIS — Z8616 Personal history of COVID-19: Secondary | ICD-10-CM | POA: Diagnosis not present

## 2021-11-01 NOTE — Progress Notes (Signed)
[place],  [id number]   CLINIC:  Medical Oncology/Hematology  PCP:  Kathyrn Drown, MD [address] [place] / [place] [address]  [phone]  REASON FOR VISIT:  Follow-up for CLL  PRIOR THERAPY: none  CURRENT THERAPY: surveillance  INTERVAL HISTORY:  [name], a 71 y.o. 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 Brandon Vega was last seen on 05/03/2021.  Today he reports feeling good. He denies fevers and weight loss. He reports occasional drenching night sweats occurring about once monthly which have been stable since his CLL diagnosis. He reports a Covid infection in January; he reports his symptoms were mild, and he had rhinorrhea and cough. He denies any other infections. He denies any palpable lumps.   REVIEW OF SYSTEMS:  Review of Systems  Constitutional:  Negative for appetite change, fatigue, fever and unexpected weight change.  HENT:   Negative for lump/mass.   Respiratory:  Positive for cough.   Cardiovascular:  Positive for leg swelling (ankle).  Endocrine: Positive for hot flashes (night sweats).  Skin:  Positive for itching.  All other systems reviewed and are negative.   PAST MEDICAL/SURGICAL HISTORY:  Past Medical History:  Diagnosis Date   Allergy    Aortic aneurysm ([facility]) 10/11/2020   On ultrasound 3.6 cm May 2020 2 repeat again in approximately 20 months.  Patient advised to quit smoking.   ED (erectile dysfunction)    Past Surgical History:  Procedure Laterality Date   COLONOSCOPY N/A 12/26/2017   Procedure: COLONOSCOPY;  Surgeon: Rogene Houston, MD;  Location: [facility];  Service: Endoscopy;  Laterality: N/A;  930   VASECTOMY      SOCIAL HISTORY:  Social History   Socioeconomic History   Marital status: Married    Spouse name: Not on file   Number of children: Not on file   Years of education: Not on file   Highest education level: Not on file  Occupational History   Not  on file  Tobacco Use   Smoking status: Every Day    Packs/day: 1.50    Types: Pipe, Cigarettes   Smokeless tobacco: Never  Substance and Sexual Activity   Alcohol use: Yes    Alcohol/week: 1.0 standard drink of alcohol    Types: 1 Glasses of wine per week   Drug use: Never   Sexual activity: Not on file  Other Topics Concern   Not on file  Social History Narrative   Not on file   Social Determinants of Health   Financial Resource Strain: Not on file  Food Insecurity: Not on file  Transportation Needs: Not on file  Physical Activity: Not on file  Stress: Not on file  Social Connections: Not on file  Intimate Partner Violence: Not on file    FAMILY HISTORY:  Family History  Problem Relation Age of Onset   Cancer Mother        throat   Hypertension Father    Diabetes Father    Hyperlipidemia Father    Cancer Father        lung   Cancer Other        breast   Heart attack Other     CURRENT MEDICATIONS:  Current Outpatient Medications  Medication Sig Dispense Refill   amLODipine (NORVASC) 5 MG tablet TAKE 1 TABLET(5 MG) BY MOUTH AT BEDTIME 90 tablet 0   Misc Natural Products ([place]) Prostate Health  Misc Natural Products (PROSTATE HEALTH PO) Take by mouth.     OVER THE COUNTER MEDICATION neugenix     No current facility-administered medications for this visit.    ALLERGIES:  Allergies  Allergen Reactions   Ace Inhibitors     cough    PHYSICAL EXAM:  Performance status (ECOG): 1 - Symptomatic but completely ambulatory  Vitals:   11/01/21 1124  BP: 132/80  Pulse: (!) 55  Resp: 16  Temp: (!) 97.5 F (36.4 C)  SpO2: 99%   Wt Readings from Last 3 Encounters:  11/01/21 192 lb 3.9 oz (87.2 kg)  06/23/21 192 lb 9.6 oz (87.4 kg)  05/03/21 188 lb 1.6 oz (85.3 kg)   Physical Exam Vitals reviewed.  Constitutional:      Appearance: Normal appearance.  Cardiovascular:     Rate and Rhythm: Normal rate and regular rhythm.     Pulses:  Normal pulses.     Heart sounds: Normal heart sounds.  Pulmonary:     Effort: Pulmonary effort is normal.     Breath sounds: Normal breath sounds.  Abdominal:     Palpations: Abdomen is soft. There is no hepatomegaly, splenomegaly or mass.     Tenderness: There is no abdominal tenderness.  Musculoskeletal:     Right lower leg: No edema.     Left lower leg: No edema.  Lymphadenopathy:     Cervical: No cervical adenopathy.     Right cervical: No superficial, deep or posterior cervical adenopathy.    Left cervical: No superficial, deep or posterior cervical adenopathy.     Upper Body:     Right upper body: No supraclavicular or axillary adenopathy.     Left upper body: No supraclavicular or axillary adenopathy.     Lower Body: No right inguinal adenopathy. No left inguinal adenopathy.  Neurological:     General: No focal deficit present.     Mental Status: He is alert and oriented to person, place, and time.  Psychiatric:        Mood and Affect: Mood normal.        Behavior: Behavior normal.     LABORATORY DATA:  I have reviewed the labs as listed.     Latest Ref Rng & Units 10/20/2021   10:09 AM 04/26/2021   11:02 AM 02/17/2021    9:52 AM  CBC  WBC 4.0 - 10.5 K/uL 10.6  12.5  10.6   Hemoglobin 13.0 - 17.0 g/dL 14.5  15.0  14.4   Hematocrit 39.0 - 52.0 % 44.5  46.6  44.0   Platelets 150 - 400 K/uL 210  240  255       Latest Ref Rng & Units 10/20/2021   10:09 AM 04/26/2021   11:02 AM 02/17/2021    9:52 AM  CMP  Glucose 70 - 99 mg/dL 88  86  96   BUN 8 - 23 mg/dL '9  14  10   '$ Creatinine 0.61 - 1.24 mg/dL 0.91  0.97  0.98   Sodium 135 - 145 mmol/L 138  137  141   Potassium 3.5 - 5.1 mmol/L 4.0  4.1  4.3   Chloride 98 - 111 mmol/L 107  102  105   CO2 22 - 32 mmol/L '27  27  21   '$ Calcium 8.9 - 10.3 mg/dL 9.0  9.4  9.6   Total Protein 6.5 - 8.1 g/dL 6.9  7.7  6.8   Total Bilirubin 0.3 - 1.2 mg/dL 1.5  1.8  1.0   Alkaline Phos 38 - 126 U/L 55  60  68   AST 15 - 41 U/L '26  29   29   '$ ALT 0 - 44 U/L '19  22  19       '$ Component Value Date/Time   RBC 4.63 10/20/2021 1009   MCV 96.1 10/20/2021 1009   MCV 91 02/17/2021 0952   MCH 31.3 10/20/2021 1009   MCHC 32.6 10/20/2021 1009   RDW 12.8 10/20/2021 1009   RDW 12.6 02/17/2021 0952   LYMPHSABS 4.9 (H) 10/20/2021 1009   LYMPHSABS 5.0 (H) 02/17/2021 0952   MONOABS 0.7 10/20/2021 1009   EOSABS 0.3 10/20/2021 1009   EOSABS 0.3 02/17/2021 0952   BASOSABS 0.1 10/20/2021 1009   BASOSABS 0.1 02/17/2021 0952    DIAGNOSTIC IMAGING:  I have independently reviewed the scans and discussed with the patient. No results found.   ASSESSMENT:  1.  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 -Denies any fevers, night sweats or weight loss.  Denies any recurrent infections or hospitalizations. -Peripheral blood flow cytometry from 01/08/2019 showed CD5 positive monoclonal B-cell population, consistent with CLL.   2.  Health maintenance: -Colonoscopy on 12/26/2016 shows diverticulosis of the sigmoid colon external hemorrhoids.   3.  Smoking history: -Does not smoke cigarettes.  However smokes pipe every day for past 30+ years.   PLAN:  1.  Stage 0 CLL: -Denies any fevers or weight loss in the last 6 months. - Reports drenching night sweats 1 episode per month since last visit. - Had COVID infection in February without any major problems.  No other infections reported.  Physical exam today did not reveal any palpable adenopathy or splenomegaly. -Reviewed labs from 10/20/2021 which showed normal LFTs with mildly elevated total bilirubin.  CBC was grossly normal with white count 10.6 and ALC 4.9.  No other cytopenias were seen.  LDH was normal. - No indication for treatment at this time.  RTC 6 months with repeat labs and physical exam.  Orders placed this encounter:  No orders of the defined types were placed in this encounter.    Derek Jack, MD [place] [facility] [phone]   I, Thana Ates, am acting as a scribe for Dr. Derek Jack.  I, Derek Jack MD, have reviewed the above documentation for accuracy and completeness, and I agree with the above.

## 2021-11-01 NOTE — Patient Instructions (Addendum)
[place] at [facility] Discharge Instructions   You were seen and examined today by Dr. Delton Coombes.  He reviewed your lab work which is normal/stable.   We will continue to monitor this condition (CLL). No need to initiate treatment at this time.   Return as scheduled in 6 months.    Thank you for choosing [place] at [facility] to provide your oncology and hematology care.  To afford each patient quality time with our provider, please arrive at least 15 minutes before your scheduled appointment time.   If you have a lab appointment with [facility] please come in thru the Main Entrance and check in at the main information desk.  You need to re-schedule your appointment should you arrive 10 or more minutes late.  We strive to give you quality time with our providers, and arriving late affects you and other patients whose appointments are after yours.  Also, if you no show three or more times for appointments you may be dismissed from the clinic at the providers discretion.     Again, thank you for choosing [facility].  Our hope is that these requests will decrease the amount of time that you wait before being seen by our physicians.       _____________________________________________________________  Should you have questions after your visit to [facility], please contact our office at [phone] and follow the prompts.  Our office hours are 8:00 a.m. and 4:30 p.m. Monday - Friday.  Please note that voicemails left after 4:00 p.m. may not be returned until the following business day.  We are closed weekends and major holidays.  You do have access to a nurse 24-7, just call the main number to the clinic [phone] and do not press any options, hold on the line and a nurse will answer the phone.    For prescription refill requests, have your pharmacy contact our office and allow 72 hours.    Due to  Covid, you will need to wear a mask upon entering the hospital. If you do not have a mask, a mask will be given to you at the Main Entrance upon arrival. For doctor visits, patients may have 1 support person age 55 or older with them. For treatment visits, patients can not have anyone with them due to social distancing guidelines and our immunocompromised population.

## 2021-11-20 ENCOUNTER — Other Ambulatory Visit: Payer: Self-pay | Admitting: Family Medicine

## 2021-12-20 ENCOUNTER — Other Ambulatory Visit: Payer: Self-pay | Admitting: Family Medicine

## 2021-12-20 ENCOUNTER — Other Ambulatory Visit: Payer: Self-pay

## 2021-12-20 MED ORDER — AMLODIPINE BESYLATE 5 MG PO TABS: 5.0000 mg | ORAL_TABLET | Freq: Every day | ORAL | 0 refills | Status: DC

## 2022-02-06 ENCOUNTER — Telehealth: Payer: Self-pay | Admitting: Family Medicine

## 2022-02-06 NOTE — Telephone Encounter (Signed)
Left message for patient to call back and schedule Medicare Annual Wellness Visit (AWV)  Please offer to do virtually or by telephone.  No hx of AWV eligible for AWVI per [facility] as of 09/29/2021  Please schedule at any time with [facility]Nurse Health Advisor.      45 minute appointment   Any questions, please call me at [phone]

## 2022-03-05 ENCOUNTER — Other Ambulatory Visit: Payer: Self-pay

## 2022-03-05 MED ORDER — AMLODIPINE BESYLATE 5 MG PO TABS: 5.0000 mg | ORAL_TABLET | Freq: Every day | ORAL | 0 refills | Status: DC

## 2022-03-28 ENCOUNTER — Other Ambulatory Visit: Payer: Self-pay | Admitting: Family Medicine

## 2022-04-02 ENCOUNTER — Ambulatory Visit (INDEPENDENT_AMBULATORY_CARE_PROVIDER_SITE_OTHER): Payer: Medicare Other | Admitting: Nurse Practitioner

## 2022-04-02 ENCOUNTER — Encounter: Payer: Self-pay | Admitting: Nurse Practitioner

## 2022-04-02 VITALS — BP 114/64 | HR 58 | Temp 98.2°F | Ht 69.0 in | Wt 194.0 lb

## 2022-04-02 DIAGNOSIS — Z0000 Encounter for general adult medical examination without abnormal findings: Secondary | ICD-10-CM

## 2022-04-02 DIAGNOSIS — H6122 Impacted cerumen, left ear: Secondary | ICD-10-CM

## 2022-04-02 NOTE — Patient Instructions (Addendum)
  Preventative health exams needed:  Colonoscopy not indicated Lung CA screening postponed today Vaccines up to date  Follow up with Dr. Nicki Reaper as need

## 2022-04-02 NOTE — Progress Notes (Signed)
   Subjective:    Patient ID: Brandon Vega, male    DOB: 12/23/1950, 71 y.o.   MRN: [id number]  HPI AWV- Annual Wellness Visit  The patient was seen for their annual wellness visit. The patient's past medical history, surgical history, and family history were reviewed. Pertinent vaccines were reviewed ( tetanus, pneumonia, shingles, flu) The patient's medication list was reviewed and updated.  The height and weight were entered.  BMI recorded in electronic record elsewhere  Cognitive screening was completed. Outcome of Mini - Cog: 5   Falls /depression screening electronically recorded within record elsewhere  Current tobacco usage: pipe (All patients who use tobacco were given written and verbal information on quitting)  Recent listing of emergency department/hospitalizations over the past year were reviewed.  current specialist the patient sees on a regular basis: CLL- hematology   Medicare annual wellness visit patient questionnaire was reviewed.  A written screening schedule for the patient for the next 5-10 years was given. Appropriate discussion of followup regarding next visit was discussed.      Review of Systems  HENT:         Left ear fullness. Feel like ear is "stopped up"  All other systems reviewed and are negative.      Objective:   Physical Exam Vitals reviewed.  Constitutional:      General: He is not in acute distress.    Appearance: Normal appearance. He is normal weight. He is not ill-appearing, toxic-appearing or diaphoretic.  HENT:     Head: Normocephalic and atraumatic.     Right Ear: There is no impacted cerumen.     Left Ear: There is impacted cerumen.  Musculoskeletal:     Comments: Ambulates without difficulty  Neurological:     Mental Status: He is alert.  Psychiatric:        Mood and Affect: Mood normal.        Behavior: Behavior normal.           Assessment & Plan:   1. Encounter for subsequent annual wellness visit  (AWV) in Medicare patient Adult wellness-complete.wellness physical was conducted today. Importance of diet and exercise were discussed in detail.  Importance of stress reduction and healthy living were discussed.  In addition to this a discussion regarding safety was also covered.  We also reviewed over immunizations and gave recommendations regarding current immunization needed for age.   In addition to this additional areas were also touched on including: Preventative health exams needed:  - Colonoscopy not indicated - Lung CA screening declined today. Patient states that he has a family history of lung cancer, but rather not know if he has it. Patient states that he has lived a full life.  - Vaccines up to date   Patient was advised yearly wellness exam  2. Cerumen impact - Patient to use over the counter Debrox or other ear removal kit - If not better, return to clinic to see PCP

## 2022-05-02 ENCOUNTER — Other Ambulatory Visit: Payer: Self-pay

## 2022-05-02 ENCOUNTER — Inpatient Hospital Stay: Payer: Medicare Other

## 2022-05-02 ENCOUNTER — Inpatient Hospital Stay: Payer: Medicare Other | Attending: Hematology | Admitting: Hematology

## 2022-05-02 ENCOUNTER — Encounter: Payer: Self-pay | Admitting: Hematology

## 2022-05-02 VITALS — BP 126/78 | HR 52 | Temp 98.1°F | Resp 148 | Ht 68.5 in | Wt 190.5 lb

## 2022-05-02 DIAGNOSIS — C9110 Chronic lymphocytic leukemia of B-cell type not having achieved remission: Secondary | ICD-10-CM

## 2022-05-02 LAB — COMPREHENSIVE METABOLIC PANEL
ALT: 21 U/L (ref 0–44)
AST: 26 U/L (ref 15–41)
Albumin: 3.9 g/dL (ref 3.5–5.0)
Alkaline Phosphatase: 62 U/L (ref 38–126)
Anion gap: 9 (ref 5–15)
BUN: 13 mg/dL (ref 8–23)
CO2: 25 mmol/L (ref 22–32)
Calcium: 9.2 mg/dL (ref 8.9–10.3)
Chloride: 105 mmol/L (ref 98–111)
Creatinine, Ser: 1 mg/dL (ref 0.61–1.24)
GFR, Estimated: >60 mL/min (ref >60)
Glucose, Bld: 86 mg/dL (ref 70–99)
Potassium: 4 mmol/L (ref 3.5–5.1)
Sodium: 139 mmol/L (ref 135–145)
Total Bilirubin: 1.4 mg/dL — ABNORMAL HIGH (ref 0.3–1.2)
Total Protein: 7.3 g/dL (ref 6.5–8.1)

## 2022-05-02 LAB — CBC WITH DIFFERENTIAL/PLATELET
Abs Immature Granulocytes: 0.04 10*3/uL (ref 0.00–0.07)
Basophils Absolute: 0.1 10*3/uL (ref 0.0–0.1)
Basophils Relative: 1 %
Eosinophils Absolute: 0.3 10*3/uL (ref 0.0–0.5)
Eosinophils Relative: 2 %
HCT: 45.7 % (ref 39.0–52.0)
Hemoglobin: 14.7 g/dL (ref 13.0–17.0)
Immature Granulocytes: 0 %
Lymphocytes Relative: 41 %
Lymphs Abs: 4.6 10*3/uL — ABNORMAL HIGH (ref 0.7–4.0)
MCH: 30.9 pg (ref 26.0–34.0)
MCHC: 32.2 g/dL (ref 30.0–36.0)
MCV: 96.2 fL (ref 80.0–100.0)
Monocytes Absolute: 0.7 10*3/uL (ref 0.1–1.0)
Monocytes Relative: 6 %
Neutro Abs: 5.4 10*3/uL (ref 1.7–7.7)
Neutrophils Relative %: 50 %
Platelets: 206 10*3/uL (ref 150–400)
RBC: 4.75 MIL/uL (ref 4.22–5.81)
RDW: 12.8 % (ref 11.5–15.5)
WBC: 11.1 10*3/uL — ABNORMAL HIGH (ref 4.0–10.5)
nRBC: 0 % (ref 0.0–0.2)

## 2022-05-02 LAB — LACTATE DEHYDROGENASE: LDH: 133 U/L (ref 98–192)

## 2022-05-02 NOTE — Progress Notes (Signed)
[facility], [place] [address]   CLINIC:  Medical Oncology/Hematology  PCP:  Kathyrn Drown, MD [address] [place] / [place] [address]  [phone]  REASON FOR VISIT:  Follow-up for CLL  PRIOR THERAPY: none  CURRENT THERAPY: surveillance  INTERVAL HISTORY:  Brandon Vega, a 71 y.o. male, seen for follow-up of CLL.  Denies any fevers, night sweats or weight loss in the last 6 months.  Denies any infections.  REVIEW OF SYSTEMS:  Review of Systems  Constitutional:  Negative for appetite change, fatigue, fever and unexpected weight change.  HENT:   Negative for lump/mass.   All other systems reviewed and are negative.   PAST MEDICAL/SURGICAL HISTORY:  Past Medical History:  Diagnosis Date   Allergy    Aortic aneurysm ([facility]) 10/11/2020   On ultrasound 3.6 cm May 2020 2 repeat again in approximately 20 months.  Patient advised to quit smoking.   ED (erectile dysfunction)    Past Surgical History:  Procedure Laterality Date   COLONOSCOPY N/A 12/26/2017   Procedure: COLONOSCOPY;  Surgeon: Rogene Houston, MD;  [facility]: [facility];  Service: Endoscopy;  Laterality: N/A;  930   VASECTOMY      SOCIAL HISTORY:  Social History   Socioeconomic History   Marital status: Married    Spouse name: Not on file   Number of children: Not on file   Years of education: Not on file   Highest education level: Not on file  Occupational History   Not on file  Tobacco Use   Smoking status: Every Day    Packs/day: 1.50    Types: Pipe, Cigarettes   Smokeless tobacco: Never  Substance and Sexual Activity   Alcohol use: Yes    Alcohol/week: 1.0 standard drink of alcohol    Types: 1 Glasses of wine per week   Drug use: Never   Sexual activity: Not on file  Other Topics Concern   Not on file  Social History Narrative   Not on file   Social Determinants of Health   Financial Resource Strain: Not on file  Food Insecurity: Not  on file  Transportation Needs: Not on file  Physical Activity: Not on file  Stress: Not on file  Social Connections: Not on file  Intimate Partner Violence: Not on file    FAMILY HISTORY:  Family History  Problem Relation Age of Onset   Cancer Mother        throat   Hypertension Father    Diabetes Father    Hyperlipidemia Father    Cancer Father        lung   Cancer Other        breast   Heart attack Other     CURRENT MEDICATIONS:  Current Outpatient Medications  Medication Sig Dispense Refill   amLODipine (NORVASC) 5 MG tablet TAKE 1 TABLET(5 MG) BY MOUTH DAILY 90 tablet 1   Misc Natural Products (PROSTATE HEALTH PO) Take 2 tablets by mouth daily.     Multiple Vitamins-Minerals (CENTRUM SILVER PO) Take 1 tablet by mouth daily.     OVER THE COUNTER MEDICATION Take 1 tablet by mouth daily. neugenix     penicillin v potassium (VEETID) 500 MG tablet Take 500 mg by mouth 3 (three) times daily.     No current facility-administered medications for this visit.    ALLERGIES:  Allergies  Allergen Reactions   Ace Inhibitors  cough    PHYSICAL EXAM:  Performance status (ECOG): 1 - Symptomatic but completely ambulatory  Vitals:   05/02/22 1203  BP: 126/78  Pulse: (!) 52  Resp: (!) 148  Temp: 98.1 F (36.7 C)  SpO2: 99%   Wt Readings from Last 3 Encounters:  05/02/22 190 lb 8 oz (86.4 kg)  04/02/22 194 lb (88 kg)  11/01/21 192 lb 3.9 oz (87.2 kg)   Physical Exam Vitals reviewed.  Constitutional:      Appearance: Normal appearance.  Cardiovascular:     Rate and Rhythm: Normal rate and regular rhythm.     Pulses: Normal pulses.     Heart sounds: Normal heart sounds.  Pulmonary:     Effort: Pulmonary effort is normal.     Breath sounds: Normal breath sounds.  Abdominal:     Palpations: Abdomen is soft. There is no hepatomegaly, splenomegaly or mass.     Tenderness: There is no abdominal tenderness.  Musculoskeletal:     Right lower leg: No edema.     Left  lower leg: No edema.  Lymphadenopathy:     Cervical: No cervical adenopathy.     Right cervical: No superficial, deep or posterior cervical adenopathy.    Left cervical: No superficial, deep or posterior cervical adenopathy.     Upper Body:     Right upper body: No supraclavicular or axillary adenopathy.     Left upper body: No supraclavicular or axillary adenopathy.     Lower Body: No right inguinal adenopathy. No left inguinal adenopathy.  Neurological:     General: No focal deficit present.     Mental Status: He is alert and oriented to person, place, and time.  Psychiatric:        Mood and Affect: Mood normal.        Behavior: Behavior normal.     LABORATORY DATA:  I have reviewed the labs as listed.     Latest Ref Rng & Units 05/02/2022   10:33 AM 10/20/2021   10:09 AM 04/26/2021   11:02 AM  CBC  WBC 4.0 - 10.5 K/uL 11.1  10.6  12.5   Hemoglobin 13.0 - 17.0 g/dL 14.7  14.5  15.0   Hematocrit 39.0 - 52.0 % 45.7  44.5  46.6   Platelets 150 - 400 K/uL 206  210  240       Latest Ref Rng & Units 05/02/2022   10:33 AM 10/20/2021   10:09 AM 04/26/2021   11:02 AM  CMP  Glucose 70 - 99 mg/dL 86  88  86   BUN 8 - 23 mg/dL '13  9  14   '$ Creatinine 0.61 - 1.24 mg/dL 1.00  0.91  0.97   Sodium 135 - 145 mmol/L 139  138  137   Potassium 3.5 - 5.1 mmol/L 4.0  4.0  4.1   Chloride 98 - 111 mmol/L 105  107  102   CO2 22 - 32 mmol/L '25  27  27   '$ Calcium 8.9 - 10.3 mg/dL 9.2  9.0  9.4   Total Protein 6.5 - 8.1 g/dL 7.3  6.9  7.7   Total Bilirubin 0.3 - 1.2 mg/dL 1.4  1.5  1.8   Alkaline Phos 38 - 126 U/L 62  55  60   AST 15 - 41 U/L '26  26  29   '$ ALT 0 - 44 U/L '21  19  22       '$ Component Value Date/Time  RBC 4.75 05/02/2022 1033   MCV 96.2 05/02/2022 1033   MCV 91 02/17/2021 0952   MCH 30.9 05/02/2022 1033   MCHC 32.2 05/02/2022 1033   RDW 12.8 05/02/2022 1033   RDW 12.6 02/17/2021 0952   LYMPHSABS 4.6 (H) 05/02/2022 1033   LYMPHSABS 5.0 (H) 02/17/2021 0952   MONOABS 0.7  05/02/2022 1033   EOSABS 0.3 05/02/2022 1033   EOSABS 0.3 02/17/2021 0952   BASOSABS 0.1 05/02/2022 1033   BASOSABS 0.1 02/17/2021 0952    DIAGNOSTIC IMAGING:  I have independently reviewed the scans and discussed with the patient. No results found.   ASSESSMENT:  1.  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 -Denies any fevers, night sweats or weight loss.  Denies any recurrent infections or hospitalizations. -Peripheral blood flow cytometry from 01/08/2019 showed CD5 positive monoclonal B-cell population, consistent with CLL.   2.  Health maintenance: -Colonoscopy on 12/26/2016 shows diverticulosis of the sigmoid colon external hemorrhoids.   3.  Smoking history: -Does not smoke cigarettes.  However smokes pipe every day for past 30+ years.   PLAN:  1.  Stage 0 CLL: - Physical exam today did not reveal any palpable adenopathy or splenomegaly. - Denies any fevers or night sweats in the last 6 months.  Denies any infections. - Reviewed labs today which showed normal LDH and LFTs.  CBC was stable with WBC 11.1, predominantly lymphocytes. - No indication for treatment at this time.  RTC 6 months for follow-up.  Orders placed this encounter:  Orders Placed This Encounter  Procedures   CBC with Differential   Comprehensive metabolic panel   Lactate dehydrogenase      Derek Jack, MD [place] [phone]

## 2022-05-24 ENCOUNTER — Telehealth: Payer: Self-pay

## 2022-05-24 NOTE — Telephone Encounter (Signed)
Referral Dr.Teoh Lady Gary for further evaluation and audiology exam

## 2022-05-24 NOTE — Telephone Encounter (Signed)
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 He states he may need a hearing aid, Please advise

## 2022-05-25 ENCOUNTER — Other Ambulatory Visit: Payer: Self-pay

## 2022-05-25 DIAGNOSIS — Z0110 Encounter for examination of ears and hearing without abnormal findings: Secondary | ICD-10-CM

## 2022-05-28 ENCOUNTER — Other Ambulatory Visit: Payer: Self-pay | Admitting: *Deleted

## 2022-05-28 DIAGNOSIS — I714 Abdominal aortic aneurysm, without rupture, unspecified: Secondary | ICD-10-CM

## 2022-05-28 NOTE — Telephone Encounter (Signed)
Referral placed in 05/25/22 by Anastasio Auerbach

## 2022-06-07 ENCOUNTER — Ambulatory Visit (HOSPITAL_COMMUNITY)
Admission: RE | Admit: 2022-06-07 | Discharge: 2022-06-07 | Disposition: A | Discharge status: Home / Self Care | Payer: Medicare Other | Source: Ambulatory Visit | Attending: Family Medicine | Admitting: Family Medicine

## 2022-06-07 DIAGNOSIS — I714 Abdominal aortic aneurysm, without rupture, unspecified: Secondary | ICD-10-CM | POA: Diagnosis not present

## 2022-06-11 DIAGNOSIS — H903 Sensorineural hearing loss, bilateral: Secondary | ICD-10-CM | POA: Diagnosis not present

## 2022-06-11 DIAGNOSIS — H90A32 Mixed conductive and sensorineural hearing loss, unilateral, left ear with restricted hearing on the contralateral side: Secondary | ICD-10-CM | POA: Diagnosis not present

## 2022-06-11 DIAGNOSIS — H6122 Impacted cerumen, left ear: Secondary | ICD-10-CM | POA: Diagnosis not present

## 2022-06-12 DIAGNOSIS — L570 Actinic keratosis: Secondary | ICD-10-CM | POA: Diagnosis not present

## 2022-06-12 DIAGNOSIS — L578 Other skin changes due to chronic exposure to nonionizing radiation: Secondary | ICD-10-CM | POA: Diagnosis not present

## 2022-06-14 DIAGNOSIS — M5451 Vertebrogenic low back pain: Secondary | ICD-10-CM | POA: Diagnosis not present

## 2022-06-21 DIAGNOSIS — M545 Low back pain, unspecified: Secondary | ICD-10-CM | POA: Diagnosis not present

## 2022-07-12 DIAGNOSIS — M5451 Vertebrogenic low back pain: Secondary | ICD-10-CM | POA: Diagnosis not present

## 2022-07-25 DIAGNOSIS — M9905 Segmental and somatic dysfunction of pelvic region: Secondary | ICD-10-CM | POA: Diagnosis not present

## 2022-07-25 DIAGNOSIS — M9903 Segmental and somatic dysfunction of lumbar region: Secondary | ICD-10-CM | POA: Diagnosis not present

## 2022-07-25 DIAGNOSIS — M9902 Segmental and somatic dysfunction of thoracic region: Secondary | ICD-10-CM | POA: Diagnosis not present

## 2022-07-25 DIAGNOSIS — M5442 Lumbago with sciatica, left side: Secondary | ICD-10-CM | POA: Diagnosis not present

## 2022-07-27 DIAGNOSIS — M9905 Segmental and somatic dysfunction of pelvic region: Secondary | ICD-10-CM | POA: Diagnosis not present

## 2022-07-27 DIAGNOSIS — M9902 Segmental and somatic dysfunction of thoracic region: Secondary | ICD-10-CM | POA: Diagnosis not present

## 2022-07-27 DIAGNOSIS — M9903 Segmental and somatic dysfunction of lumbar region: Secondary | ICD-10-CM | POA: Diagnosis not present

## 2022-07-27 DIAGNOSIS — M5442 Lumbago with sciatica, left side: Secondary | ICD-10-CM | POA: Diagnosis not present

## 2022-07-30 DIAGNOSIS — M9905 Segmental and somatic dysfunction of pelvic region: Secondary | ICD-10-CM | POA: Diagnosis not present

## 2022-07-30 DIAGNOSIS — M5442 Lumbago with sciatica, left side: Secondary | ICD-10-CM | POA: Diagnosis not present

## 2022-07-30 DIAGNOSIS — M9902 Segmental and somatic dysfunction of thoracic region: Secondary | ICD-10-CM | POA: Diagnosis not present

## 2022-07-30 DIAGNOSIS — M9903 Segmental and somatic dysfunction of lumbar region: Secondary | ICD-10-CM | POA: Diagnosis not present

## 2022-08-01 DIAGNOSIS — M9902 Segmental and somatic dysfunction of thoracic region: Secondary | ICD-10-CM | POA: Diagnosis not present

## 2022-08-01 DIAGNOSIS — M9905 Segmental and somatic dysfunction of pelvic region: Secondary | ICD-10-CM | POA: Diagnosis not present

## 2022-08-01 DIAGNOSIS — M9903 Segmental and somatic dysfunction of lumbar region: Secondary | ICD-10-CM | POA: Diagnosis not present

## 2022-08-01 DIAGNOSIS — M5442 Lumbago with sciatica, left side: Secondary | ICD-10-CM | POA: Diagnosis not present

## 2022-08-06 DIAGNOSIS — M9902 Segmental and somatic dysfunction of thoracic region: Secondary | ICD-10-CM | POA: Diagnosis not present

## 2022-08-06 DIAGNOSIS — M9903 Segmental and somatic dysfunction of lumbar region: Secondary | ICD-10-CM | POA: Diagnosis not present

## 2022-08-06 DIAGNOSIS — M5442 Lumbago with sciatica, left side: Secondary | ICD-10-CM | POA: Diagnosis not present

## 2022-08-06 DIAGNOSIS — M9905 Segmental and somatic dysfunction of pelvic region: Secondary | ICD-10-CM | POA: Diagnosis not present

## 2022-08-13 DIAGNOSIS — M5442 Lumbago with sciatica, left side: Secondary | ICD-10-CM | POA: Diagnosis not present

## 2022-08-13 DIAGNOSIS — M9903 Segmental and somatic dysfunction of lumbar region: Secondary | ICD-10-CM | POA: Diagnosis not present

## 2022-08-13 DIAGNOSIS — M9905 Segmental and somatic dysfunction of pelvic region: Secondary | ICD-10-CM | POA: Diagnosis not present

## 2022-08-13 DIAGNOSIS — M9902 Segmental and somatic dysfunction of thoracic region: Secondary | ICD-10-CM | POA: Diagnosis not present

## 2022-08-17 DIAGNOSIS — M5451 Vertebrogenic low back pain: Secondary | ICD-10-CM | POA: Diagnosis not present

## 2022-08-17 DIAGNOSIS — M25552 Pain in left hip: Secondary | ICD-10-CM | POA: Diagnosis not present

## 2022-09-24 ENCOUNTER — Other Ambulatory Visit: Payer: Self-pay | Admitting: Family Medicine

## 2022-10-11 ENCOUNTER — Encounter: Payer: Self-pay | Admitting: Family Medicine

## 2022-10-11 ENCOUNTER — Ambulatory Visit (INDEPENDENT_AMBULATORY_CARE_PROVIDER_SITE_OTHER): Payer: Medicare Other | Admitting: Family Medicine

## 2022-10-11 VITALS — BP 132/82 | HR 54 | Ht 68.5 in | Wt 194.4 lb

## 2022-10-11 DIAGNOSIS — R197 Diarrhea, unspecified: Secondary | ICD-10-CM

## 2022-10-11 DIAGNOSIS — I10 Essential (primary) hypertension: Secondary | ICD-10-CM

## 2022-10-11 MED ORDER — DIPHENOXYLATE-ATROPINE 2.5-0.025 MG PO TABS: ORAL_TABLET | ORAL | 0 refills | Status: DC

## 2022-10-11 NOTE — Progress Notes (Signed)
   Subjective:    Patient ID: Brandon Vega, male    DOB: 1950-05-19, 72 y.o.   MRN: [id number]  HPI Patient arrives today with digestive (gas,diarrhea) 4 to 6 times a day going on for 6 months. Patient states terrible stomach cramps. Imodium does not seem to help.  Intermittent loose stools with formed stools sometimes mucousy sometimes not explosive stools 4-6 times per day denies fever chills sweats denies abdominal cramping.  No blood in the stools.  No weight loss.  No fevers at nighttime. Review of Systems     Objective:   Physical Exam  General-in no acute distress Eyes-no discharge Lungs-respiratory rate normal, CTA CV-no murmurs,RRR Extremities skin warm dry no edema Neuro grossly normal Behavior normal, alert Abdomen is soft no guarding rebound or tenderness      Assessment & Plan:  1. Diarrhea, unspecified type Will go ahead with lab work.  Try to rule out celiac disease.  In addition to this referral to gastroenterology for further evaluation and treatment.  Blood pressure decent control continue current measures - CBC with Differential - Tissue Transglutaminase Abs,IgG,IgA - Hepatic function panel May use Lomotil on a as needed basis not for frequent use 2. Primary hypertension Blood work, continue medication, healthy diet, blood pressure good today - CBC with Differential - Tissue Transglutaminase Abs,IgG,IgA - Hepatic function panel  I have sent a staff message to Dr. Levon Hedger regarding if they would like for [place] to do stool studies

## 2022-10-12 NOTE — Addendum Note (Signed)
Addended by: Margaretha Sheffield on: 10/12/2022 01:38 PM   Modules accepted: Orders

## 2022-10-14 LAB — CBC WITH DIFFERENTIAL/PLATELET
Basophils Absolute: 0.1 10*3/uL (ref 0.0–0.2)
Basos: 1 %
EOS (ABSOLUTE): 0.3 10*3/uL (ref 0.0–0.4)
Eos: 2 %
Hematocrit: 46.1 % (ref 37.5–51.0)
Hemoglobin: 15.4 g/dL (ref 13.0–17.7)
Immature Grans (Abs): 0 10*3/uL (ref 0.0–0.1)
Immature Granulocytes: 0 %
Lymphocytes Absolute: 5.7 10*3/uL — ABNORMAL HIGH (ref 0.7–3.1)
Lymphs: 43 %
MCH: 31.1 pg (ref 26.6–33.0)
MCHC: 33.4 g/dL (ref 31.5–35.7)
MCV: 93 fL (ref 79–97)
Monocytes Absolute: 0.8 10*3/uL (ref 0.1–0.9)
Monocytes: 6 %
Neutrophils Absolute: 6.4 10*3/uL (ref 1.4–7.0)
Neutrophils: 48 %
Platelets: 247 10*3/uL (ref 150–450)
RBC: 4.95 x10E6/uL (ref 4.14–5.80)
RDW: 12.5 % (ref 11.6–15.4)
WBC: 13.3 10*3/uL — ABNORMAL HIGH (ref 3.4–10.8)

## 2022-10-14 LAB — TISSUE TRANSGLUTAMINASE ABS,IGG,IGA
Tissue Transglut Ab: 3 U/mL (ref 0–5)
Transglutaminase IgA: <2 U/mL (ref 0–3)

## 2022-10-14 LAB — HEPATIC FUNCTION PANEL
ALT: 20 IU/L (ref 0–44)
AST: 27 IU/L (ref 0–40)
Albumin: 4.5 g/dL (ref 3.8–4.8)
Alkaline Phosphatase: 75 IU/L (ref 44–121)
Bilirubin Total: 1.9 mg/dL — ABNORMAL HIGH (ref 0.0–1.2)
Bilirubin, Direct: 0.33 mg/dL (ref 0.00–0.40)
Total Protein: 7.3 g/dL (ref 6.0–8.5)

## 2022-10-16 ENCOUNTER — Encounter (INDEPENDENT_AMBULATORY_CARE_PROVIDER_SITE_OTHER): Payer: Self-pay | Admitting: *Deleted

## 2022-10-23 ENCOUNTER — Telehealth: Payer: Self-pay | Admitting: Family Medicine

## 2022-10-23 NOTE — Telephone Encounter (Signed)
Nurses-this patient was recently seen for lower abdominal pain discomfort along with explosive stools.  At that time we did order some testing but also recommended for the patient to have consultation with gastroenterologist.  Dr. Levon Hedger recommends to have CT scan of the abdomen and lower pelvis due to reoccurring lower abdominal pain with explosive diarrhea.  The purpose of this is to look for any colitis issues going on.  Please talk with Brandon Vega -the patient-if he is on board with this lets go ahead with ordering CT scan abdomen pelvis with contrast.  If patient does not want to do this test he can discuss it further with gastroenterology when he sees him in July.  Thanks-Dr. Lorin Picket If he does consent to the CT scan this CT scan needs to be completed within the next 3 weeks so that we have the results back before his visit on July 11 with gastroenterology thank you   Marguerita Merles, Brandon Boom, MD  Babs Sciara, MD [facility], It will not hurt doing some cross-sectional abdominal imaging (possibly a CT of the abdomen and pelvis with IV contrast) to have a better evaluation of the pain episodes and evaluate any inflammatory condition with this.       Previous Messages    ----- Message ----- From: Babs Sciara, MD Sent: 10/11/2022   9:02 PM EDT To: Dolores Frame, MD  [facility] you are doing well. This patient has had 6 months of lower abdominal pain and discomfort especially after eating but throughout the day these episodes will occur with explosive loose diarrhea.  No steatorrhea.  No hematochezia.  I am doing lab work CMP, CBC, tissue transglutaminase workup.  Symptomatically treating with Lomotil because Imodium is not helping.  I have put in to the nurses to refer this patient to your practice for further evaluation.  If you would like any additional testing completed please let me know.  Several years ago we did do stool testing after he was on a mission trip to  [place].  He has not been on any foreign travel recently.  If you would like for [place] to do stool testing please let me know what you would like and I will be happy to order it before his appointment with your practice.  Thanks again-Brandon Vega [employer]

## 2022-10-24 ENCOUNTER — Other Ambulatory Visit: Payer: Self-pay

## 2022-10-24 ENCOUNTER — Other Ambulatory Visit: Payer: Self-pay | Admitting: Family Medicine

## 2022-10-24 ENCOUNTER — Telehealth: Payer: Self-pay | Admitting: Family Medicine

## 2022-10-24 DIAGNOSIS — R1084 Generalized abdominal pain: Secondary | ICD-10-CM

## 2022-10-24 DIAGNOSIS — R197 Diarrhea, unspecified: Secondary | ICD-10-CM

## 2022-10-24 NOTE — Telephone Encounter (Signed)
Refill request pended to provider for approval.

## 2022-10-24 NOTE — Telephone Encounter (Signed)
Spoke with patient and informed per drs recommendations. Orders for CT placed, patient verbalized understanding.

## 2022-10-24 NOTE — Telephone Encounter (Signed)
Refill on  diphenoxylate-atropine (LOMOTIL) 2.5-0.025 MG tablet  send to [facility] [address]

## 2022-11-06 NOTE — Progress Notes (Signed)
[facility] [address] [place] [address]    Clinic Day:  11/07/2022  Referring physician: Babs Sciara, MD  Patient Care Team: Babs Sciara, MD as PCP - General (Family Medicine) Doreatha Massed, MD as Consulting Physician (Hematology)   ASSESSMENT & PLAN:   Assessment: 1.  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 -Denies any fevers, night sweats or weight loss.  Denies any recurrent infections or hospitalizations. -Peripheral blood flow cytometry from 01/08/2019 showed CD5 positive monoclonal B-cell population, consistent with CLL.   2.  Health maintenance: -Colonoscopy on 12/26/2016 shows diverticulosis of the sigmoid colon external hemorrhoids.   3.  Smoking history: -Does not smoke cigarettes.  However smokes pipe every day for past 30+ years.    Plan: 1.  Stage 0 CLL: - No B symptoms.  No infections.  Mild night sweats once per month. - Physical exam: No palpable adenopathy or splenomegaly. - He reports having diarrhea for the last couple of months and will follow-up with GI service. - Reviewed labs: Mildly elevated white count is stable.  No cytopenias.  LDH normal.  No indication for treatment.  RTC 6 months for follow-up.    Orders Placed This Encounter  Procedures   CBC with Differential    Standing Status:   Future    Standing Expiration Date:   11/07/2023   Comprehensive metabolic panel    Standing Status:   Future    Standing Expiration Date:   11/07/2023   Lactate dehydrogenase    Standing Status:   Future    Standing Expiration Date:   11/07/2023      I,Katie Daubenspeck,acting as a scribe for Doreatha Massed, MD.,have documented all relevant documentation on the behalf of Doreatha Massed, MD,as directed by  Doreatha Massed, MD while in the presence of Doreatha Massed, MD.   I, Doreatha Massed MD, have reviewed the above documentation for accuracy and completeness, and I  agree with the above.   Doreatha Massed, MD   6/26/20245:14 PM  CHIEF COMPLAINT:   Diagnosis: CLL    Cancer Staging  No matching staging information was found for the patient.   Prior Therapy: none  Current Therapy:  surveillance    HISTORY OF PRESENT ILLNESS:   Oncology History   No history exists.     INTERVAL HISTORY:   Brandon Vega is a 72 y.o. male presenting to clinic today for follow up of CLL. He was last seen by me on 05/02/22.  Today, he states that he is doing well overall. His appetite level is at 100%. His energy level is at 50%.  PAST MEDICAL HISTORY:   Past Medical History: Past Medical History:  Diagnosis Date   Allergy    Aortic aneurysm ([facility]) 10/11/2020   On ultrasound 3.6 cm May 2020 2 repeat again in approximately 20 months.  Patient advised to quit smoking.   ED (erectile dysfunction)     Surgical History: Past Surgical History:  Procedure Laterality Date   COLONOSCOPY N/A 12/26/2017   Procedure: COLONOSCOPY;  Surgeon: Malissa Hippo, MD;  [facility];  Service: Endoscopy;  Laterality: N/A;  930   VASECTOMY      Social History: Social History   Socioeconomic History   Marital status: Married    Spouse name: Not on file   Number of children: Not on file   Years of education: Not on file   Highest education level: Not on file  Occupational History  Not on file  Tobacco Use   Smoking status: Every Day    Packs/day: 1.5    Types: Pipe, Cigarettes   Smokeless tobacco: Never  Substance and Sexual Activity   Alcohol use: Yes    Alcohol/week: 1.0 standard drink of alcohol    Types: 1 Glasses of wine per week   Drug use: Never   Sexual activity: Not on file  Other Topics Concern   Not on file  Social History Narrative   Not on file   Social Determinants of Health   Financial Resource Strain: Not on file  Food Insecurity: Not on file  Transportation Needs: Not on file  Physical Activity: Not on file  Stress: Not  on file  Social Connections: Not on file  Intimate Partner Violence: Not on file    Family History: Family History  Problem Relation Age of Onset   Cancer Mother        throat   Hypertension Father    Diabetes Father    Hyperlipidemia Father    Cancer Father        lung   Cancer Other        breast   Heart attack Other     Current Medications:  Current Outpatient Medications:    amLODipine (NORVASC) 5 MG tablet, TAKE 1 TABLET(5 MG) BY MOUTH DAILY, Disp: 90 tablet, Rfl: 1   diphenoxylate-atropine (LOMOTIL) 2.5-0.025 MG tablet, TAKE 1 TO 2 TABLETS BY MOUTH EVERY 6 HOURS AS NEEDED FOR DIARRHEA, Disp: 30 tablet, Rfl: 0   Misc Natural Products (PROSTATE HEALTH PO), Take 2 tablets by mouth daily., Disp: , Rfl:    Multiple Vitamins-Minerals (CENTRUM SILVER PO), Take 1 tablet by mouth daily., Disp: , Rfl:    OVER THE COUNTER MEDICATION, Take 1 tablet by mouth daily. neugenix, Disp: , Rfl:    penicillin v potassium (VEETID) 500 MG tablet, Take 500 mg by mouth 3 (three) times daily., Disp: , Rfl:    Allergies: Allergies  Allergen Reactions   Ace Inhibitors     cough    REVIEW OF SYSTEMS:   Review of Systems  Constitutional:  Negative for chills, fatigue and fever.  HENT:   Negative for lump/mass, mouth sores, nosebleeds, sore throat and trouble swallowing.   Eyes:  Negative for eye problems.  Respiratory:  Negative for cough and shortness of breath.   Cardiovascular:  Negative for chest pain, leg swelling and palpitations.  Gastrointestinal:  Positive for diarrhea. Negative for abdominal pain, constipation, nausea and vomiting.  Genitourinary:  Negative for bladder incontinence, difficulty urinating, dysuria, frequency, hematuria and nocturia.   Musculoskeletal:  Negative for arthralgias, back pain, flank pain, myalgias and neck pain.  Skin:  Negative for itching and rash.  Neurological:  Positive for dizziness and numbness. Negative for headaches.  Hematological:  Does not  bruise/bleed easily.  Psychiatric/Behavioral:  Negative for depression, sleep disturbance and suicidal ideas. The patient is not nervous/anxious.   All other systems reviewed and are negative.    VITALS:   Blood pressure 131/79, pulse (!) 53, temperature 98.2 F (36.8 C), temperature source Oral, resp. rate 17, height 5' 8.5" (1.74 m), weight 193 lb 1.6 oz (87.6 kg), SpO2 99 %.  Wt Readings from Last 3 Encounters:  11/07/22 193 lb 1.6 oz (87.6 kg)  10/11/22 194 lb 6.4 oz (88.2 kg)  05/02/22 190 lb 8 oz (86.4 kg)    Body mass index is 28.93 kg/m.  Performance status (ECOG): 1 - Symptomatic but completely  ambulatory  PHYSICAL EXAM:   Physical Exam Vitals and nursing note reviewed. Exam conducted with a chaperone present.  Constitutional:      Appearance: Normal appearance.  Cardiovascular:     Rate and Rhythm: Normal rate and regular rhythm.     Pulses: Normal pulses.     Heart sounds: Normal heart sounds.  Pulmonary:     Effort: Pulmonary effort is normal.     Breath sounds: Normal breath sounds.  Abdominal:     Palpations: Abdomen is soft. There is no hepatomegaly, splenomegaly or mass.     Tenderness: There is no abdominal tenderness.  Musculoskeletal:     Right lower leg: No edema.     Left lower leg: No edema.  Lymphadenopathy:     Cervical: No cervical adenopathy.     Right cervical: No superficial, deep or posterior cervical adenopathy.    Left cervical: No superficial, deep or posterior cervical adenopathy.     Upper Body:     Right upper body: No supraclavicular or axillary adenopathy.     Left upper body: No supraclavicular or axillary adenopathy.  Neurological:     General: No focal deficit present.     Mental Status: He is alert and oriented to person, place, and time.  Psychiatric:        Mood and Affect: Mood normal.        Behavior: Behavior normal.     LABS:      Latest Ref Rng & Units 11/07/2022   10:34 AM 10/11/2022   10:52 AM 05/02/2022    10:33 AM  CBC  WBC 4.0 - 10.5 K/uL 12.2  13.3  11.1   Hemoglobin 13.0 - 17.0 g/dL 78.2  95.6  21.3   Hematocrit 39.0 - 52.0 % 45.4  46.1  45.7   Platelets 150 - 400 K/uL 222  247  206       Latest Ref Rng & Units 11/07/2022   10:34 AM 10/11/2022   10:52 AM 05/02/2022   10:33 AM  CMP  Glucose 70 - 99 mg/dL 88   86   BUN 8 - 23 mg/dL 18   13   Creatinine 0.86 - 1.24 mg/dL 5.78   4.69   Sodium 629 - 145 mmol/L 137   139   Potassium 3.5 - 5.1 mmol/L 4.0   4.0   Chloride 98 - 111 mmol/L 106   105   CO2 22 - 32 mmol/L 23   25   Calcium 8.9 - 10.3 mg/dL 9.2   9.2   Total Protein 6.5 - 8.1 g/dL 7.2  7.3  7.3   Total Bilirubin 0.3 - 1.2 mg/dL 1.9  1.9  1.4   Alkaline Phos 38 - 126 U/L 49  75  62   AST 15 - 41 U/L 26  27  26    ALT 0 - 44 U/L 24  20  21       No results found for: "CEA1", "CEA" / No results found for: "CEA1", "CEA" Lab Results  Component Value Date   PSA1 0.2 09/21/2020   No results found for: "BMW413" No results found for: "CAN125"  No results found for: "TOTALPROTELP", "ALBUMINELP", "A1GS", "A2GS", "BETS", "BETA2SER", "GAMS", "MSPIKE", "SPEI" No results found for: "TIBC", "FERRITIN", "IRONPCTSAT" Lab Results  Component Value Date   LDH 141 11/07/2022   LDH 133 05/02/2022   LDH 125 10/20/2021     STUDIES:   No results found.

## 2022-11-07 ENCOUNTER — Inpatient Hospital Stay: Payer: Medicare Other | Attending: Hematology | Admitting: Hematology

## 2022-11-07 ENCOUNTER — Inpatient Hospital Stay: Payer: Medicare Other

## 2022-11-07 VITALS — BP 131/79 | HR 53 | Temp 98.2°F | Resp 17 | Ht 68.5 in | Wt 193.1 lb

## 2022-11-07 DIAGNOSIS — C9110 Chronic lymphocytic leukemia of B-cell type not having achieved remission: Secondary | ICD-10-CM | POA: Diagnosis not present

## 2022-11-07 DIAGNOSIS — F17290 Nicotine dependence, other tobacco product, uncomplicated: Secondary | ICD-10-CM | POA: Insufficient documentation

## 2022-11-07 LAB — COMPREHENSIVE METABOLIC PANEL
ALT: 24 U/L (ref 0–44)
AST: 26 U/L (ref 15–41)
Albumin: 4.1 g/dL (ref 3.5–5.0)
Alkaline Phosphatase: 49 U/L (ref 38–126)
Anion gap: 8 (ref 5–15)
BUN: 18 mg/dL (ref 8–23)
CO2: 23 mmol/L (ref 22–32)
Calcium: 9.2 mg/dL (ref 8.9–10.3)
Chloride: 106 mmol/L (ref 98–111)
Creatinine, Ser: 0.94 mg/dL (ref 0.61–1.24)
GFR, Estimated: >60 mL/min (ref >60)
Glucose, Bld: 88 mg/dL (ref 70–99)
Potassium: 4 mmol/L (ref 3.5–5.1)
Sodium: 137 mmol/L (ref 135–145)
Total Bilirubin: 1.9 mg/dL — ABNORMAL HIGH (ref 0.3–1.2)
Total Protein: 7.2 g/dL (ref 6.5–8.1)

## 2022-11-07 LAB — CBC WITH DIFFERENTIAL/PLATELET
Abs Immature Granulocytes: 0.03 10*3/uL (ref 0.00–0.07)
Basophils Absolute: 0.1 10*3/uL (ref 0.0–0.1)
Basophils Relative: 1 %
Eosinophils Absolute: 0.2 10*3/uL (ref 0.0–0.5)
Eosinophils Relative: 2 %
HCT: 45.4 % (ref 39.0–52.0)
Hemoglobin: 14.9 g/dL (ref 13.0–17.0)
Immature Granulocytes: 0 %
Lymphocytes Relative: 44 %
Lymphs Abs: 5.4 10*3/uL — ABNORMAL HIGH (ref 0.7–4.0)
MCH: 31.2 pg (ref 26.0–34.0)
MCHC: 32.8 g/dL (ref 30.0–36.0)
MCV: 95 fL (ref 80.0–100.0)
Monocytes Absolute: 0.8 10*3/uL (ref 0.1–1.0)
Monocytes Relative: 6 %
Neutro Abs: 5.7 10*3/uL (ref 1.7–7.7)
Neutrophils Relative %: 47 %
Platelets: 222 10*3/uL (ref 150–400)
RBC: 4.78 MIL/uL (ref 4.22–5.81)
RDW: 12.7 % (ref 11.5–15.5)
WBC: 12.2 10*3/uL — ABNORMAL HIGH (ref 4.0–10.5)
nRBC: 0 % (ref 0.0–0.2)

## 2022-11-07 LAB — LACTATE DEHYDROGENASE: LDH: 141 U/L (ref 98–192)

## 2022-11-07 NOTE — Patient Instructions (Signed)
[facility] at [facility] ?Discharge Instructions ? ? ?You were seen and examined today by Dr. Katragadda. ? ?He reviewed the results of your lab work which are normal/stable.  ? ?Return as scheduled. ? ? ?Thank you for choosing [facility] at [facility] to provide your oncology and hematology care.  To afford each patient quality time with our provider, please arrive at least 15 minutes before your scheduled appointment time.  ? ?If you have a lab appointment with the Cancer Center please come in thru the Main Entrance and check in at the main information desk. ? ?You need to re-schedule your appointment should you arrive 10 or more minutes late.  We strive to give you quality time with our providers, and arriving late affects you and other patients whose appointments are after yours.  Also, if you no show three or more times for appointments you may be dismissed from the clinic at the providers discretion.     ?Again, thank you for choosing [facility].  Our hope is that these requests will decrease the amount of time that you wait before being seen by our physicians.       ?_____________________________________________________________ ? ?Should you have questions after your visit to [facility], please contact our office at [phone] and follow the prompts.  Our office hours are 8:00 a.m. and 4:30 p.m. Monday - Friday.  Please note that voicemails left after 4:00 p.m. may not be returned until the following business day.  We are closed weekends and major holidays.  You do have access to a nurse 24-7, just call the main number to the clinic [phone] and do not press any options, hold on the line and a nurse will answer the phone.   ? ?For prescription refill requests, have your pharmacy contact our office and allow 72 hours.   ? ?Due to Covid, you will need to wear a mask upon entering the hospital. If you do not have a mask, a mask  will be given to you at the Main Entrance upon arrival. For doctor visits, patients may have 1 support person age 18 or older with them. For treatment visits, patients can not have anyone with them due to social distancing guidelines and our immunocompromised population.  ? ?   ?

## 2022-11-09 ENCOUNTER — Other Ambulatory Visit: Payer: Self-pay | Admitting: Family Medicine

## 2022-11-22 ENCOUNTER — Ambulatory Visit (INDEPENDENT_AMBULATORY_CARE_PROVIDER_SITE_OTHER): Payer: Medicare Other | Admitting: Gastroenterology

## 2022-11-22 ENCOUNTER — Encounter (INDEPENDENT_AMBULATORY_CARE_PROVIDER_SITE_OTHER): Payer: Self-pay | Admitting: Gastroenterology

## 2022-11-22 VITALS — BP 132/78 | HR 57 | Temp 97.1°F | Ht 69.0 in | Wt 193.4 lb

## 2022-11-22 DIAGNOSIS — R197 Diarrhea, unspecified: Secondary | ICD-10-CM | POA: Diagnosis not present

## 2022-11-22 DIAGNOSIS — K529 Noninfective gastroenteritis and colitis, unspecified: Secondary | ICD-10-CM

## 2022-11-22 NOTE — Progress Notes (Signed)
[name], M.D. Gastroenterology & Hepatology [facility] Gastroenterology [address] [place], [place] [address] Primary Care Physician: Babs Sciara, MD [address] [place] [address]  Referring MD: PCP  Chief Complaint:  Diarrhea  History of Present Illness: Brandon Vega is a 72 y.o. male with past medical history of CLL, erectile dysfunction, who presents for evaluation of diarrhea.  Patient reports that he went to a mission trip to [place] in 2022. He states that just prior to his travel he noticed some recurrent episodes of diarrhea. He reports that he was having diarrhea on a regular basis but this became worse for the last 4 months. He reports that has diarrhea 8 bowel movements per day, which have different consistency. Reports having significant bloating and pressure throughout his abdomen. Reports that he was prescribed Lomotil 2 tablets every day, which slows down the diarrhea. States that moving his bowels leads to significant abdominal pain. Has not felt Imodium and Peptobismol helped slowing down the diarrhea. Has to wake up from sleep to have a bowel movement.  The patient denies having any nausea, vomiting, fever, chills, hematochezia, melena, hematemesis, abdominal distention, abdominal pain, diarrhea, jaundice, pruritus or weight loss.  Most recent labs from 11/07/2022 showed CBC with WBC 12.2, rest of cell lines within normal limits, CMP with normal electrolytes including calcium of 9.2, total bilirubin was 1.9 with rest of LFTs within normal limits and normal kidney function.  Had negative tissue transglutaminase on 10/11/2022.  Also had negative C. difficile and Giardia stool testing in October 2022.  Last FTD:DUKGU Last Colonoscopy: 12/26/2017 Diverticulosis and external hemorrhoids  FHx: neg for any gastrointestinal/liver disease, mother and father lung cancer Social: smokes pipe occasionally, neg alcohol or  illicit drug use Surgical: no abdominal surgeries  Past Medical History: Past Medical History:  Diagnosis Date   Allergy    Aortic aneurysm (HCC) 10/11/2020   On ultrasound 3.6 cm May 2020 2 repeat again in approximately 20 months.  Patient advised to quit smoking.   ED (erectile dysfunction)     Past Surgical History: Past Surgical History:  Procedure Laterality Date   COLONOSCOPY N/A 12/26/2017   Procedure: COLONOSCOPY;  Surgeon: Malissa Hippo, MD;  [facility]: [facility];  Service: Endoscopy;  Laterality: N/A;  930   VASECTOMY      Family History: Family History  Problem Relation Age of Onset   Cancer Mother        throat   Hypertension Father    Diabetes Father    Hyperlipidemia Father    Cancer Father        lung   Cancer Other        breast   Heart attack Other     Social History: Social History   Tobacco Use  Smoking Status Every Day   Current packs/day: 1.50   Types: Pipe, Cigarettes  Smokeless Tobacco Never   Social History   Substance and Sexual Activity  Alcohol Use Yes   Alcohol/week: 1.0 standard drink of alcohol   Types: 1 Glasses of wine per week   Social History   Substance and Sexual Activity  Drug Use Never    Allergies: Allergies  Allergen Reactions   Ace Inhibitors     cough    Medications: Current Outpatient Medications  Medication Sig Dispense Refill   amLODipine (NORVASC) 5 MG tablet TAKE 1 TABLET(5 MG) BY MOUTH DAILY 90 tablet 1   diphenoxylate-atropine (LOMOTIL) 2.5-0.025  MG tablet TAKE 1 TO 2 TABLETS BY MOUTH EVERY 6 HOURS AS NEEDED FOR DIARRHEA 30 tablet 2   Misc Natural Products (PROSTATE HEALTH PO) Take 2 tablets by mouth daily.     Multiple Vitamins-Minerals (CENTRUM SILVER PO) Take 1 tablet by mouth daily.     OVER THE COUNTER MEDICATION Take 1 tablet by mouth daily. neugenix     No current facility-administered medications for this visit.    Review of Systems: GENERAL: negative for malaise, night  sweats HEENT: No changes in hearing or vision, no nose bleeds or other nasal problems. NECK: Negative for lumps, goiter, pain and significant neck swelling RESPIRATORY: Negative for cough, wheezing CARDIOVASCULAR: Negative for chest pain, leg swelling, palpitations, orthopnea GI: SEE HPI MUSCULOSKELETAL: Negative for joint pain or swelling, back pain, and muscle pain. SKIN: Negative for lesions, rash PSYCH: Negative for sleep disturbance, mood disorder and recent psychosocial stressors. HEMATOLOGY Negative for prolonged bleeding, bruising easily, and swollen nodes. ENDOCRINE: Negative for cold or heat intolerance, polyuria, polydipsia and goiter. NEURO: negative for tremor, gait imbalance, syncope and seizures. The remainder of the review of systems is noncontributory.   Physical Exam: BP 132/78 (BP Location: Left Arm, Patient Position: Sitting, Cuff Size: Large)   Pulse (!) 57   Temp (!) 97.1 F (36.2 C) (Temporal)   Ht 5\' 9"  (1.753 m)   Wt 193 lb 6.4 oz (87.7 kg)   BMI 28.56 kg/m  GENERAL: The patient is AO x3, in no acute distress. HEENT: Head is normocephalic and atraumatic. EOMI are intact. Mouth is well hydrated and without lesions. NECK: Supple. No masses LUNGS: Clear to auscultation. No presence of rhonchi/wheezing/rales. Adequate chest expansion HEART: RRR, normal s1 and s2. ABDOMEN: Soft, nontender, no guarding, no peritoneal signs, and nondistended. BS + and increased. No masses. EXTREMITIES: Without any cyanosis, clubbing, rash, lesions or edema. NEUROLOGIC: AOx3, no focal motor deficit. SKIN: no jaundice, no rashes   Imaging/Labs: as above  I personally reviewed and interpreted the available labs, imaging and endoscopic files.  Impression and Plan: Brandon Vega is a 72 y.o. male with past medical history of CLL, erectile dysfunction, who presents for evaluation of diarrhea.  The patient has presented recurrent episodes of diarrhea for the last couple of years  which has been concerning as he is having nighttime symptoms frequently.  He has been somewhat responsive to Lomotil but not to other antidiarrheals.  He has not presented any red flag signs.  Notably, he has been traveling to [place] in the past which raises the concern for chronic gastrointestinal infections endemic to [place].  Even though he had some stool testing in the past, I consider it is important to have evaluation with GI pathogen panel and ova and parasites x 3.  We will also evaluate for pancreatic etiologies with stool tests.  Will also check for thyroid dysfunction.  If this workup is unremarkable, will need to proceed with colonoscopy with random colonic biopsies.  Finally, he should proceed with the scheduled CT of the abdomen and pelvis with IV contrast ordered by his PCP.  -Check TSH, fecal fat/elastase, GI pathogen panel and ova and parasites x 3 - If negative investigations, will proceed with colonoscopy. - Continue with Lomotil 1 to 2 pills every 6 hours for diarrhea - Avoid products that have lactose if possible. - Please let us know once you receive the results of the CT scan of the abdomen and pelvis  All questions were answered.  [name], MD Gastroenterology and Hepatology [facility] Gastroenterology

## 2022-11-22 NOTE — H&P (View-Only) (Signed)
[name], M.D. Gastroenterology & Hepatology [facility] Gastroenterology [address] [place], [place] [address] Primary Care Physician: Babs Sciara, MD [address] [place] [address]  Referring MD: PCP  Chief Complaint:  Diarrhea  History of Present Illness: Brandon Vega is a 72 y.o. male with past medical history of CLL, erectile dysfunction, who presents for evaluation of diarrhea.  Patient reports that he went to a mission trip to [place] in 2022. He states that just prior to his travel he noticed some recurrent episodes of diarrhea. He reports that he was having diarrhea on a regular basis but this became worse for the last 4 months. He reports that has diarrhea 8 bowel movements per day, which have different consistency. Reports having significant bloating and pressure throughout his abdomen. Reports that he was prescribed Lomotil 2 tablets every day, which slows down the diarrhea. States that moving his bowels leads to significant abdominal pain. Has not felt Imodium and Peptobismol helped slowing down the diarrhea. Has to wake up from sleep to have a bowel movement.  The patient denies having any nausea, vomiting, fever, chills, hematochezia, melena, hematemesis, abdominal distention, abdominal pain, diarrhea, jaundice, pruritus or weight loss.  Most recent labs from 11/07/2022 showed CBC with WBC 12.2, rest of cell lines within normal limits, CMP with normal electrolytes including calcium of 9.2, total bilirubin was 1.9 with rest of LFTs within normal limits and normal kidney function.  Had negative tissue transglutaminase on 10/11/2022.  Also had negative C. difficile and Giardia stool testing in October 2022.  Last FTD:DUKGU Last Colonoscopy: 12/26/2017 Diverticulosis and external hemorrhoids  FHx: neg for any gastrointestinal/liver disease, mother and father lung cancer Social: smokes pipe occasionally, neg alcohol or  illicit drug use Surgical: no abdominal surgeries  Past Medical History: Past Medical History:  Diagnosis Date   Allergy    Aortic aneurysm (HCC) 10/11/2020   On ultrasound 3.6 cm May 2020 2 repeat again in approximately 20 months.  Patient advised to quit smoking.   ED (erectile dysfunction)     Past Surgical History: Past Surgical History:  Procedure Laterality Date   COLONOSCOPY N/A 12/26/2017   Procedure: COLONOSCOPY;  Surgeon: Malissa Hippo, MD;  [facility]: [facility];  Service: Endoscopy;  Laterality: N/A;  930   VASECTOMY      Family History: Family History  Problem Relation Age of Onset   Cancer Mother        throat   Hypertension Father    Diabetes Father    Hyperlipidemia Father    Cancer Father        lung   Cancer Other        breast   Heart attack Other     Social History: Social History   Tobacco Use  Smoking Status Every Day   Current packs/day: 1.50   Types: Pipe, Cigarettes  Smokeless Tobacco Never   Social History   Substance and Sexual Activity  Alcohol Use Yes   Alcohol/week: 1.0 standard drink of alcohol   Types: 1 Glasses of wine per week   Social History   Substance and Sexual Activity  Drug Use Never    Allergies: Allergies  Allergen Reactions   Ace Inhibitors     cough    Medications: Current Outpatient Medications  Medication Sig Dispense Refill   amLODipine (NORVASC) 5 MG tablet TAKE 1 TABLET(5 MG) BY MOUTH DAILY 90 tablet 1   diphenoxylate-atropine (LOMOTIL) 2.5-0.025  MG tablet TAKE 1 TO 2 TABLETS BY MOUTH EVERY 6 HOURS AS NEEDED FOR DIARRHEA 30 tablet 2   Misc Natural Products (PROSTATE HEALTH PO) Take 2 tablets by mouth daily.     Multiple Vitamins-Minerals (CENTRUM SILVER PO) Take 1 tablet by mouth daily.     OVER THE COUNTER MEDICATION Take 1 tablet by mouth daily. neugenix     No current facility-administered medications for this visit.    Review of Systems: GENERAL: negative for malaise, night  sweats HEENT: No changes in hearing or vision, no nose bleeds or other nasal problems. NECK: Negative for lumps, goiter, pain and significant neck swelling RESPIRATORY: Negative for cough, wheezing CARDIOVASCULAR: Negative for chest pain, leg swelling, palpitations, orthopnea GI: SEE HPI MUSCULOSKELETAL: Negative for joint pain or swelling, back pain, and muscle pain. SKIN: Negative for lesions, rash PSYCH: Negative for sleep disturbance, mood disorder and recent psychosocial stressors. HEMATOLOGY Negative for prolonged bleeding, bruising easily, and swollen nodes. ENDOCRINE: Negative for cold or heat intolerance, polyuria, polydipsia and goiter. NEURO: negative for tremor, gait imbalance, syncope and seizures. The remainder of the review of systems is noncontributory.   Physical Exam: BP 132/78 (BP Location: Left Arm, Patient Position: Sitting, Cuff Size: Large)   Pulse (!) 57   Temp (!) 97.1 F (36.2 C) (Temporal)   Ht 5\' 9"  (1.753 m)   Wt 193 lb 6.4 oz (87.7 kg)   BMI 28.56 kg/m  GENERAL: The patient is AO x3, in no acute distress. HEENT: Head is normocephalic and atraumatic. EOMI are intact. Mouth is well hydrated and without lesions. NECK: Supple. No masses LUNGS: Clear to auscultation. No presence of rhonchi/wheezing/rales. Adequate chest expansion HEART: RRR, normal s1 and s2. ABDOMEN: Soft, nontender, no guarding, no peritoneal signs, and nondistended. BS + and increased. No masses. EXTREMITIES: Without any cyanosis, clubbing, rash, lesions or edema. NEUROLOGIC: AOx3, no focal motor deficit. SKIN: no jaundice, no rashes   Imaging/Labs: as above  I personally reviewed and interpreted the available labs, imaging and endoscopic files.  Impression and Plan: Brandon Vega is a 72 y.o. male with past medical history of CLL, erectile dysfunction, who presents for evaluation of diarrhea.  The patient has presented recurrent episodes of diarrhea for the last couple of years  which has been concerning as he is having nighttime symptoms frequently.  He has been somewhat responsive to Lomotil but not to other antidiarrheals.  He has not presented any red flag signs.  Notably, he has been traveling to [place] in the past which raises the concern for chronic gastrointestinal infections endemic to [place].  Even though he had some stool testing in the past, I consider it is important to have evaluation with GI pathogen panel and ova and parasites x 3.  We will also evaluate for pancreatic etiologies with stool tests.  Will also check for thyroid dysfunction.  If this workup is unremarkable, will need to proceed with colonoscopy with random colonic biopsies.  Finally, he should proceed with the scheduled CT of the abdomen and pelvis with IV contrast ordered by his PCP.  -Check TSH, fecal fat/elastase, GI pathogen panel and ova and parasites x 3 - If negative investigations, will proceed with colonoscopy. - Continue with Lomotil 1 to 2 pills every 6 hours for diarrhea - Avoid products that have lactose if possible. - Please let us know once you receive the results of the CT scan of the abdomen and pelvis  All questions were answered.  [name], MD Gastroenterology and Hepatology [facility] Gastroenterology

## 2022-11-22 NOTE — Patient Instructions (Signed)
Perform blood and stool workup If negative investigations, will proceed with colonoscopy. Continue with Lomotil 1 to 2 pills every 6 hours for diarrhea Avoid products that have lactose if possible. Please let us know once you receive the results of the CT scan of the abdomen and pelvis

## 2022-11-23 DIAGNOSIS — K529 Noninfective gastroenteritis and colitis, unspecified: Secondary | ICD-10-CM | POA: Diagnosis not present

## 2022-11-23 LAB — TSH: TSH: 2.93 mIU/L (ref 0.40–4.50)

## 2022-11-25 IMAGING — US US ABDOMINAL AORTA SCREENING AAA
1 series · 14 of 25 positions shown · non-contrast
Comparison: None.

CLINICAL DATA: Hypertension.  Current smoker.

EXAM:
US ABDOMINAL AORTA MEDICARE SCREENING
TECHNIQUE: Ultrasound examination of the abdominal aorta was performed as a
screening evaluation for abdominal aortic aneurysm.

[Series 1: us aorta medicare screening · 14 of 27 slices shown]
[im 1/27]
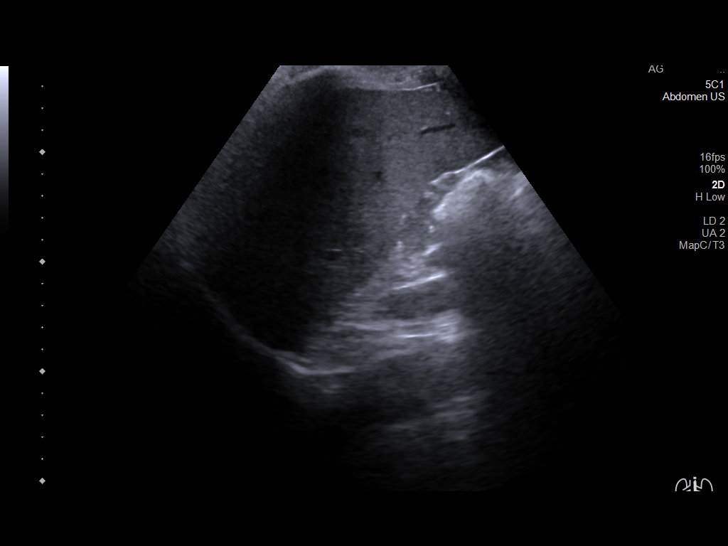
[im 3/27]
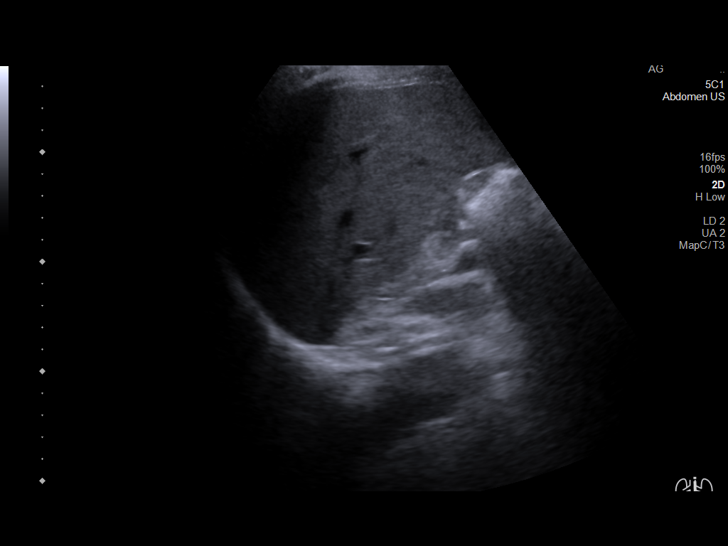
[im 5/27]
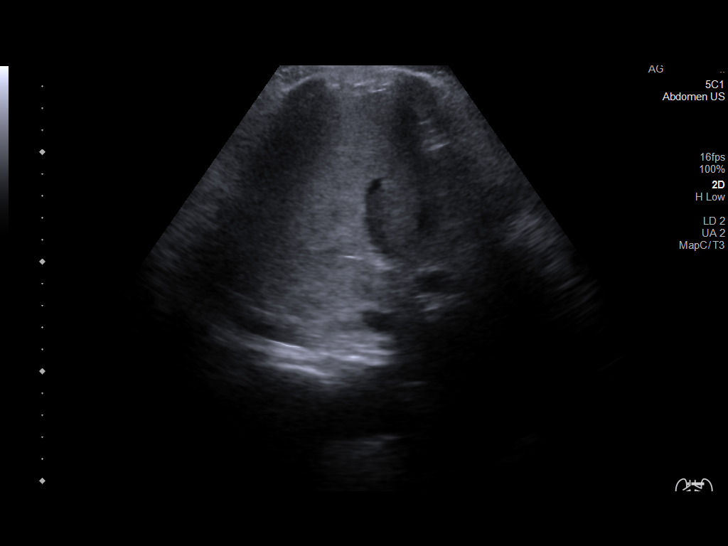
[im 7/27]
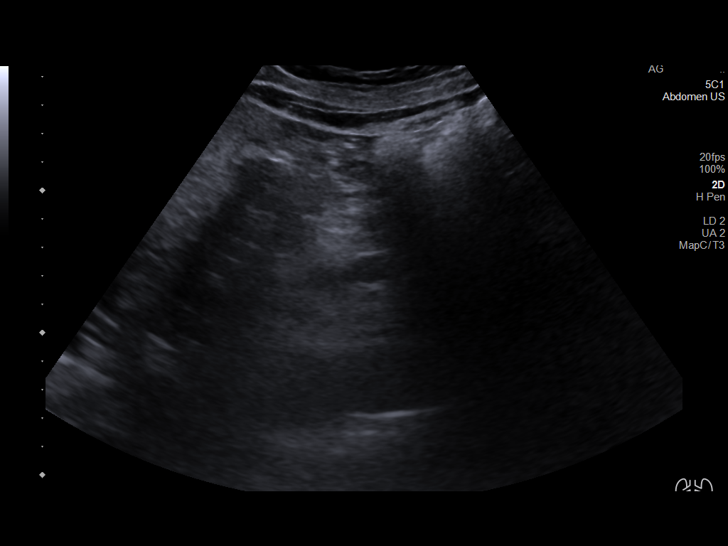
[im 9/27]
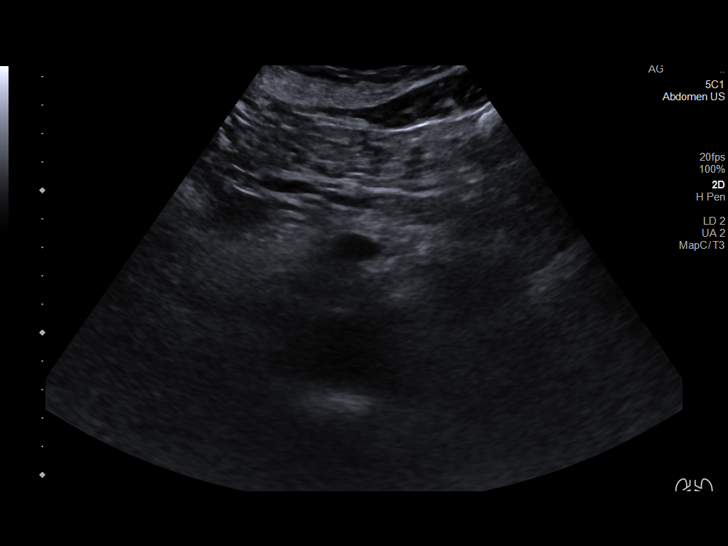
[im 10/27]
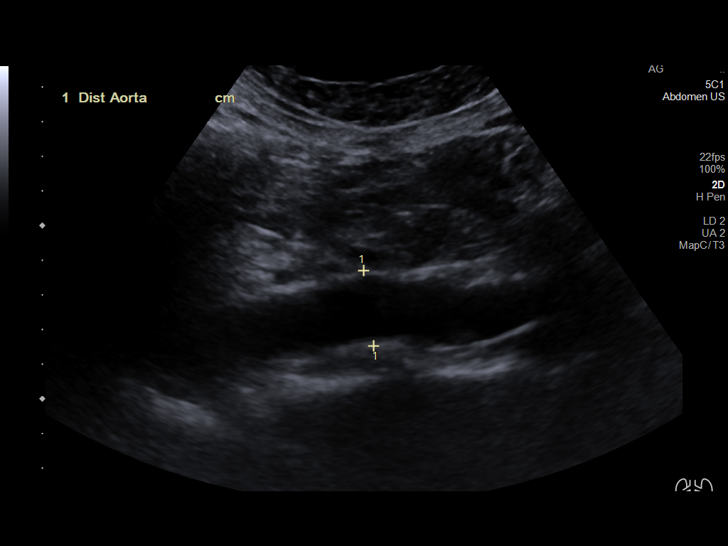
[im 12/27]
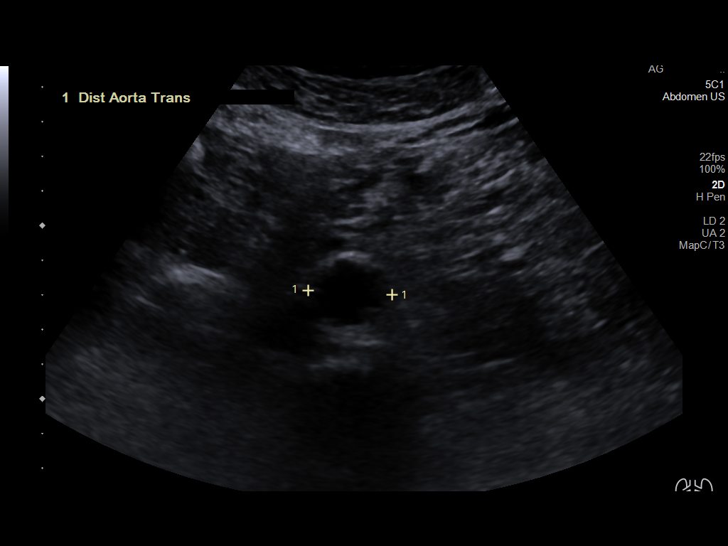
[im 15/27]
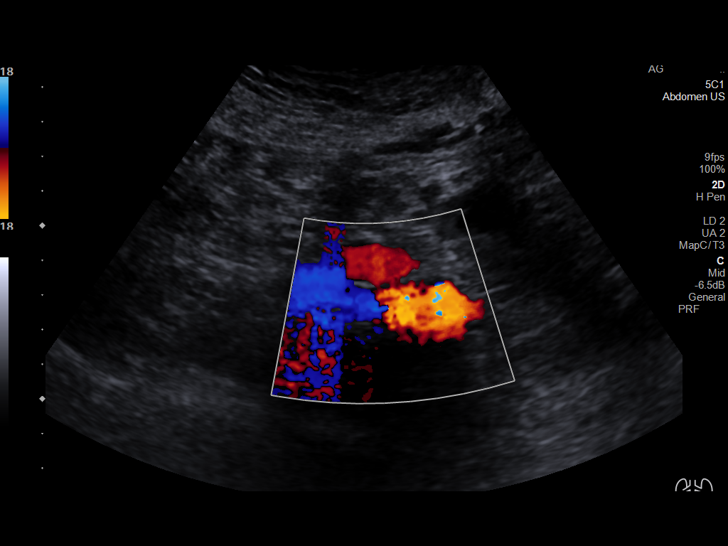
[im 17/27]
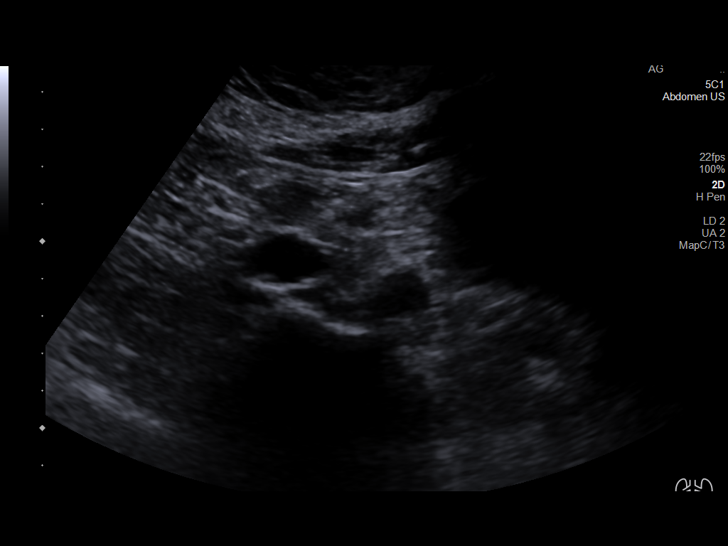
[im 18/27]
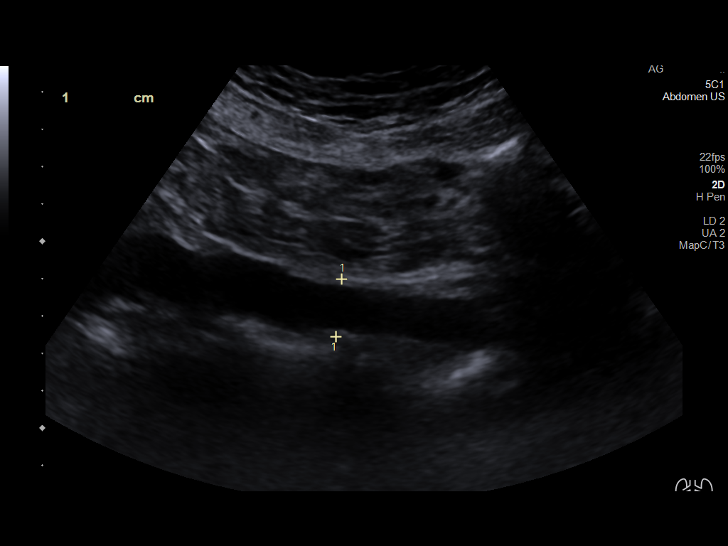
[im 20/27]
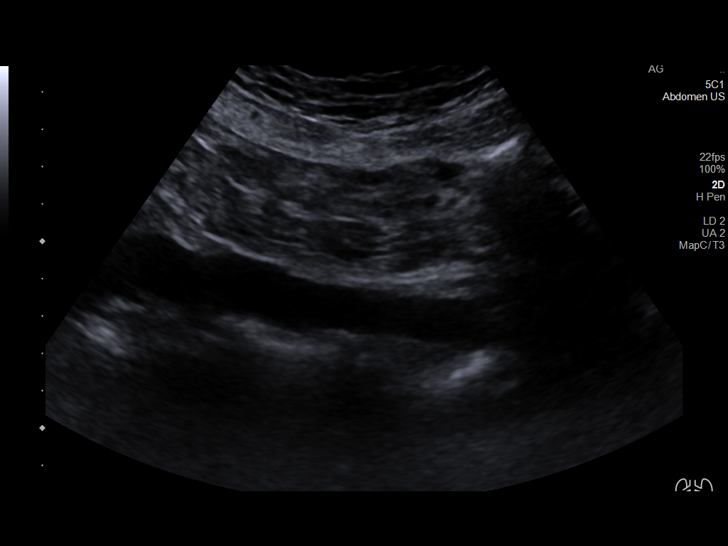
[im 22/27]
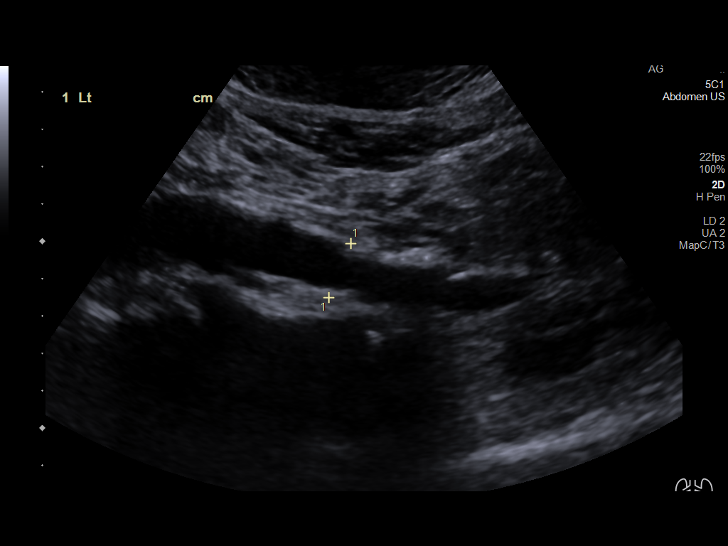
[im 24/27]
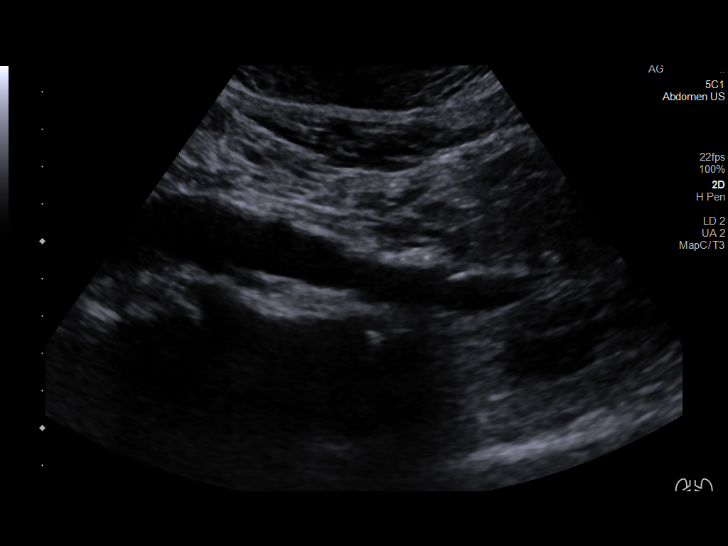
[im 27/27]
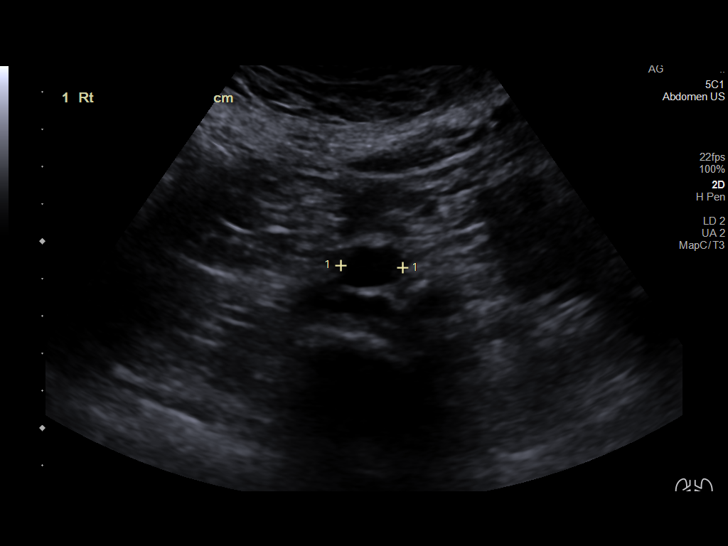

[14 of 25 positions shown; findings below may reference images not displayed]

FINDINGS: Abdominal aortic measurements as follows:

Proximal:  3.6 x 3.5 cm cm

Mid:  2.7 x 2.6 cm cm

Distal:  2.2 x 2.4 cm cm

Right iliac artery measures 1.6 x 1.7 cm.

Left earache artery measures 1.6 x 1.6 cm.
IMPRESSION: Mild aneurysmal dilation of the proximal abdominal aorta. Recommend
follow-up ultrasound every 2 years. This recommendation follows ACR
consensus guidelines: White Paper of the ACR Incidental Findings
Committee II on Vascular Findings. [HOSPITAL] 7693;

## 2022-11-30 LAB — GASTROINTESTINAL PATHOGEN PNL
CampyloBacter Group: NOT DETECTED
Norovirus GI/GII: NOT DETECTED
Rotavirus A: NOT DETECTED
Salmonella species: NOT DETECTED
Shiga Toxin 1: NOT DETECTED
Shiga Toxin 2: NOT DETECTED
Shigella Species: NOT DETECTED
Vibrio Group: NOT DETECTED
Yersinia enterocolitica: NOT DETECTED

## 2022-11-30 LAB — PANCREATIC ELASTASE, FECAL: Pancreatic Elastase-1, Stool: 325 mcg/g

## 2022-11-30 LAB — FECAL FAT, QUALITATIVE: FECAL FAT, QUALITATIVE: NORMAL

## 2022-12-03 ENCOUNTER — Telehealth (INDEPENDENT_AMBULATORY_CARE_PROVIDER_SITE_OTHER): Payer: Self-pay | Admitting: *Deleted

## 2022-12-03 ENCOUNTER — Other Ambulatory Visit (INDEPENDENT_AMBULATORY_CARE_PROVIDER_SITE_OTHER): Payer: Self-pay | Admitting: *Deleted

## 2022-12-03 DIAGNOSIS — K529 Noninfective gastroenteritis and colitis, unspecified: Secondary | ICD-10-CM

## 2022-12-03 NOTE — Telephone Encounter (Signed)
Will need to collect a sample in 3 different days (total of 3 samples).  Please let the patient know about this.

## 2022-12-03 NOTE — Telephone Encounter (Signed)
3 separate orders put in to do on 3 separate days and pt came by office today to pick up orders and I explained to him he would need to do 3 different days. He verbalized understanding.

## 2022-12-03 NOTE — Telephone Encounter (Signed)
Copied staff message below so I could document in chart: Brandon Frame, MD  Metro Kung, LPN [facility] Toniann Fail, Can we check with the lab if the ova and parasite test were sent? Thanks  [facility] and they never received ova and parasite test. I asked lab if they could use stool sample they had to process ova and parasite and they are unable to. Ova and parasite test was put in for standing order on 11/22/22 and not sure if it needs to be standing and if that was why lab did not collect. I put in a new order for ova and parasite and pt is aware to pick up order form from office and take to [facility] to get container to collect stool sample.

## 2022-12-05 DIAGNOSIS — K529 Noninfective gastroenteritis and colitis, unspecified: Secondary | ICD-10-CM | POA: Diagnosis not present

## 2022-12-05 NOTE — Progress Notes (Signed)
Discussed with patient per Dr. Levon Hedger - the stool testing was negative for bacterial infections. Testing did not show a pancreatic disease causing his diarrhea.  We are still waiting the results for parasites  Patient verbalized understanding.

## 2022-12-06 LAB — OVA AND PARASITE EXAMINATION
CONCENTRATE RESULT:: NONE SEEN
CONCENTRATE RESULT:: NONE SEEN
MICRO NUMBER:: 15241861
MICRO NUMBER:: 15241884
SPECIMEN QUALITY:: ADEQUATE
SPECIMEN QUALITY:: ADEQUATE
SPECIMEN QUALITY:: ADEQUATE
TRICHROME RESULT:: NONE SEEN
TRICHROME RESULT:: NONE SEEN

## 2022-12-10 ENCOUNTER — Other Ambulatory Visit (INDEPENDENT_AMBULATORY_CARE_PROVIDER_SITE_OTHER): Payer: Self-pay | Admitting: Gastroenterology

## 2022-12-10 MED ORDER — PEG 3350-KCL-NA BICARB-NACL 420 G PO SOLR: 4000.0000 mL | Freq: Once | ORAL | 0 refills | Status: AC

## 2022-12-14 ENCOUNTER — Ambulatory Visit (HOSPITAL_COMMUNITY): Admission: RE | Admit: 2022-12-14 | Payer: Medicare Other | Source: Ambulatory Visit

## 2022-12-14 DIAGNOSIS — R1084 Generalized abdominal pain: Secondary | ICD-10-CM | POA: Diagnosis not present

## 2022-12-14 DIAGNOSIS — K7689 Other specified diseases of liver: Secondary | ICD-10-CM | POA: Diagnosis not present

## 2022-12-14 DIAGNOSIS — R197 Diarrhea, unspecified: Secondary | ICD-10-CM | POA: Diagnosis not present

## 2022-12-14 DIAGNOSIS — K8020 Calculus of gallbladder without cholecystitis without obstruction: Secondary | ICD-10-CM | POA: Diagnosis not present

## 2022-12-14 DIAGNOSIS — K5730 Diverticulosis of large intestine without perforation or abscess without bleeding: Secondary | ICD-10-CM | POA: Diagnosis not present

## 2022-12-14 MED ORDER — IOHEXOL 300 MG/ML  SOLN
100.0000 mL | Freq: Once | INTRAMUSCULAR | Status: AC | PRN
  Administered 2022-12-14: 100 mL via INTRAVENOUS

## 2022-12-19 ENCOUNTER — Ambulatory Visit (HOSPITAL_BASED_OUTPATIENT_CLINIC_OR_DEPARTMENT_OTHER): Payer: Medicare Other | Admitting: Anesthesiology

## 2022-12-19 ENCOUNTER — Other Ambulatory Visit: Payer: Self-pay

## 2022-12-19 ENCOUNTER — Encounter (HOSPITAL_COMMUNITY): Payer: Self-pay | Admitting: Gastroenterology

## 2022-12-19 ENCOUNTER — Encounter (INDEPENDENT_AMBULATORY_CARE_PROVIDER_SITE_OTHER): Payer: Self-pay | Admitting: *Deleted

## 2022-12-19 ENCOUNTER — Encounter (HOSPITAL_COMMUNITY)
Admission: RE | Disposition: A | Discharge status: Home / Self Care | Payer: Self-pay | Source: Home / Self Care | Attending: Gastroenterology

## 2022-12-19 ENCOUNTER — Ambulatory Visit (HOSPITAL_COMMUNITY): Payer: Medicare Other | Admitting: Anesthesiology

## 2022-12-19 ENCOUNTER — Ambulatory Visit (HOSPITAL_COMMUNITY)
Admission: RE | Admit: 2022-12-19 | Discharge: 2022-12-19 | Disposition: A | Discharge status: Home / Self Care | Payer: Medicare Other | Attending: Gastroenterology | Admitting: Gastroenterology

## 2022-12-19 DIAGNOSIS — I10 Essential (primary) hypertension: Secondary | ICD-10-CM

## 2022-12-19 DIAGNOSIS — K5730 Diverticulosis of large intestine without perforation or abscess without bleeding: Secondary | ICD-10-CM | POA: Insufficient documentation

## 2022-12-19 DIAGNOSIS — C9110 Chronic lymphocytic leukemia of B-cell type not having achieved remission: Secondary | ICD-10-CM | POA: Insufficient documentation

## 2022-12-19 DIAGNOSIS — F17290 Nicotine dependence, other tobacco product, uncomplicated: Secondary | ICD-10-CM | POA: Insufficient documentation

## 2022-12-19 DIAGNOSIS — K621 Rectal polyp: Secondary | ICD-10-CM | POA: Insufficient documentation

## 2022-12-19 DIAGNOSIS — K635 Polyp of colon: Secondary | ICD-10-CM | POA: Diagnosis not present

## 2022-12-19 DIAGNOSIS — D126 Benign neoplasm of colon, unspecified: Secondary | ICD-10-CM | POA: Diagnosis not present

## 2022-12-19 DIAGNOSIS — I714 Abdominal aortic aneurysm, without rupture, unspecified: Secondary | ICD-10-CM | POA: Diagnosis not present

## 2022-12-19 DIAGNOSIS — K648 Other hemorrhoids: Secondary | ICD-10-CM | POA: Insufficient documentation

## 2022-12-19 DIAGNOSIS — F17210 Nicotine dependence, cigarettes, uncomplicated: Secondary | ICD-10-CM | POA: Insufficient documentation

## 2022-12-19 DIAGNOSIS — K529 Noninfective gastroenteritis and colitis, unspecified: Secondary | ICD-10-CM | POA: Insufficient documentation

## 2022-12-19 DIAGNOSIS — D122 Benign neoplasm of ascending colon: Secondary | ICD-10-CM

## 2022-12-19 HISTORY — PX: BIOPSY: SHX5522

## 2022-12-19 HISTORY — PX: POLYPECTOMY: SHX5525

## 2022-12-19 HISTORY — PX: COLONOSCOPY WITH PROPOFOL: SHX5780

## 2022-12-19 LAB — HM COLONOSCOPY

## 2022-12-19 SURGERY — COLONOSCOPY WITH PROPOFOL
Anesthesia: General

## 2022-12-19 MED ORDER — PROPOFOL 500 MG/50ML IV EMUL
INTRAVENOUS | Status: DC | PRN
  Administered 2022-12-19: 150 ug/kg/min via INTRAVENOUS

## 2022-12-19 MED ORDER — STERILE WATER FOR IRRIGATION IR SOLN
Status: DC | PRN
  Administered 2022-12-19: 120 mL

## 2022-12-19 MED ORDER — PHENYLEPHRINE HCL (PRESSORS) 10 MG/ML IV SOLN
INTRAVENOUS | Status: DC | PRN
  Administered 2022-12-19: 160 ug via INTRAVENOUS

## 2022-12-19 MED ORDER — LACTATED RINGERS IV SOLN
INTRAVENOUS | Status: DC
  Administered 2022-12-19: 1000 mL via INTRAVENOUS

## 2022-12-19 MED ORDER — PROPOFOL 10 MG/ML IV BOLUS
INTRAVENOUS | Status: DC | PRN
Start: 2022-12-19 — End: 2022-12-19
  Administered 2022-12-19: 50 mg via INTRAVENOUS

## 2022-12-19 NOTE — Interval H&P Note (Signed)
History and Physical Interval Note:  12/19/2022 8:02 AM  Brandon Vega  has presented today for surgery, with the diagnosis of CHRONIC DIARRHEA.  The various methods of treatment have been discussed with the patient and family. After consideration of risks, benefits and other options for treatment, the patient has consented to  Procedure(s) with comments: COLONOSCOPY WITH PROPOFOL (N/A) - 9:00AM;ASA 1 as a surgical intervention.  The patient's history has been reviewed, patient examined, no change in status, stable for surgery.  I have reviewed the patient's chart and labs.  Questions were answered to the patient's satisfaction.     [name] [place]

## 2022-12-19 NOTE — Anesthesia Postprocedure Evaluation (Signed)
Anesthesia Post Note  Patient: Brandon Vega  Procedure(s) Performed: COLONOSCOPY WITH PROPOFOL POLYPECTOMY BIOPSY  Patient location during evaluation: Phase II Anesthesia Type: General Level of consciousness: awake and alert and oriented Pain management: pain level controlled Vital Signs Assessment: post-procedure vital signs reviewed and stable Respiratory status: spontaneous breathing, nonlabored ventilation and respiratory function stable Cardiovascular status: blood pressure returned to baseline and stable Postop Assessment: no apparent nausea or vomiting Anesthetic complications: no  No notable events documented.   Last Vitals:  Vitals:   12/19/22 0742 12/19/22 0927  BP: 130/81 99/64  Pulse: 62 81  Resp: 15 18  Temp: 36.6 C 36.4 C  SpO2: 97% 95%    Last Pain:  Vitals:   12/19/22 0927  TempSrc: Oral  PainSc: 0-No pain                  C 

## 2022-12-19 NOTE — Transfer of Care (Signed)
Immediate Anesthesia Transfer of Care Note  Patient: Brandon Vega  Procedure(s) Performed: COLONOSCOPY WITH PROPOFOL POLYPECTOMY BIOPSY  Patient Location: PACU  Anesthesia Type:General  Level of Consciousness: sedated  Airway & Oxygen Therapy: Patient Spontanous Breathing  Post-op Assessment: Report given to RN and Post -op Vital signs reviewed and stable  Post vital signs: Reviewed and stable  Last Vitals:  Vitals Value Taken Time  BP 99/64 12/19/22 0927  Temp 36.4 C 12/19/22 0927  Pulse 81 12/19/22 0927  Resp 18 12/19/22 0927  SpO2 95 % 12/19/22 0927    Last Pain:  Vitals:   12/19/22 0927  TempSrc: Oral  PainSc: 0-No pain      Patients Stated Pain Goal: 8 (12/19/22 0742)  Complications: No notable events documented.

## 2022-12-19 NOTE — Op Note (Signed)
[facility] Patient Name: Brandon Vega Procedure Date: 12/19/2022 8:51 AM MRN: [id number] Date of Birth: 1951-01-25 Attending MD: [name] , , [id number] CSN: [id number] Age: 72 Admit Type: Inpatient Procedure:                Colonoscopy Indications:              Chronic diarrhea Providers:                [name], Sanjuan Dame, MD, Sheran Fava, Zena Amos Referring MD:              Medicines:                Monitored Anesthesia Care Complications:            No immediate complications. Estimated Blood Loss:     Estimated blood loss: none. Procedure:                Pre-Anesthesia Assessment:                           - Prior to the procedure, a History and Physical                            was performed, and patient medications, allergies                            and sensitivities were reviewed. The patient's                            tolerance of previous anesthesia was reviewed.                           - The risks and benefits of the procedure and the                            sedation options and risks were discussed with the                            patient. All questions were answered and informed                            consent was obtained.                           - ASA Grade Assessment: II - A patient with mild                            systemic disease.                           After obtaining informed consent, the colonoscope                            was passed under direct vision. Throughout the  procedure, the patient's blood pressure, pulse, and                            oxygen saturations were monitored continuously. The                            [id number]-[id number] ([id number]) scope was introduced through                            the anus and advanced to the the cecum, identified                            by appendiceal orifice and ileocecal valve. The                             colonoscopy was performed without difficulty. The                            patient tolerated the procedure well. The quality                            of the bowel preparation was excellent. Scope In: 9:02:29 AM Scope Out: 9:24:10 AM Scope Withdrawal Time: 0 hours 13 minutes 36 seconds  Total Procedure Duration: 0 hours 21 minutes 41 seconds  Findings:      The perianal and digital rectal examinations were normal.      A 4 mm polyp was found in the ascending colon. The polyp was sessile.       The polyp was removed with a cold snare. Resection and retrieval were       complete.      Scattered small-mouthed diverticula were found in the sigmoid colon and       descending colon. Biopsies for histology were taken with a cold forceps       from the right colon and left colon for evaluation of microscopic       colitis.      A 2 mm polyp was found in the rectum. The polyp was sessile. The polyp       was removed with a cold snare. Resection and retrieval were complete.      Non-bleeding internal hemorrhoids were found during retroflexion. The       hemorrhoids were small. Impression:               - One 4 mm polyp in the ascending colon, removed                            with a cold snare. Resected and retrieved.                           - Diverticulosis in the sigmoid colon and in the                            descending colon. Biopsied.                           - One 2 mm polyp  in the rectum, removed with a cold                            snare. Resected and retrieved.                           - Non-bleeding internal hemorrhoids. Moderate Sedation:      Per Anesthesia Care Recommendation:           - Discharge patient to home (ambulatory).                           - Resume previous diet.                           - Await pathology results.                           - Repeat colonoscopy for surveillance based on                            pathology results.                            - Follow result of CT abdomen and pelvis with IV                            contrast.                           - Take Lomotil as needed. Procedure Code(s):        --- Professional ---                           [phone], Colonoscopy, flexible; with removal of                            tumor(s), polyp(s), or other lesion(s) by snare                            technique                           45380, 59, Colonoscopy, flexible; with biopsy,                            single or multiple Diagnosis Code(s):        --- Professional ---                           D12.2, Benign neoplasm of ascending colon                           D12.8, Benign neoplasm of rectum                           K64.8, Other hemorrhoids  K52.9, Noninfective gastroenteritis and colitis,                            unspecified                           K57.30, Diverticulosis of large intestine without                            perforation or abscess without bleeding CPT copyright 2022 American Medical Association. All rights reserved. The codes documented in this report are preliminary and upon coder review may  be revised to meet current compliance requirements. [name], MD [name],  12/19/2022 9:32:34 AM This report has been signed electronically. Sanjuan Dame, MD Number of Addenda: 0

## 2022-12-19 NOTE — Anesthesia Preprocedure Evaluation (Addendum)
Anesthesia Evaluation  Patient identified by MRN, date of birth, ID band Patient awake    Reviewed: Allergy & Precautions, H&P , NPO status , Patient's Chart, lab work & pertinent test results  Airway Mallampati: II  TM Distance: >3 FB Neck ROM: Full    Dental  (+) Dental Advisory Given, Caps   Pulmonary Current Smoker and Patient abstained from smoking.   Pulmonary exam normal breath sounds clear to auscultation       Cardiovascular Exercise Tolerance: Good hypertension, Pt. on medications Normal cardiovascular exam Rhythm:Regular Rate:Normal     Neuro/Psych negative neurological ROS  negative psych ROS   GI/Hepatic negative GI ROS, Neg liver ROS,,,  Endo/Other  negative endocrine ROS    Renal/GU negative Renal ROS  negative genitourinary   Musculoskeletal negative musculoskeletal ROS (+)    Abdominal   Peds negative pediatric ROS (+)  Hematology  (+) Blood dyscrasia (CLL)   Anesthesia Other Findings IMPRESSION: 1. Proximal abdominal aortic aneurysm, 3.5 cm. Two year ultrasound follow-up recommended. 2. 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     Electronically Signed   By: Layla Maw M.D.   On: 06/07/2022 09:32   Reproductive/Obstetrics negative OB ROS                             Anesthesia Physical Anesthesia Plan  ASA: 3  Anesthesia Plan: General   Post-op Pain Management: Minimal or no pain anticipated   Induction: Intravenous  PONV Risk Score and Plan: 1 and Propofol infusion  Airway Management Planned: Nasal Cannula and Natural Airway  Additional Equipment:   Intra-op Plan:   Post-operative Plan:   Informed Consent: I have reviewed the patients History and Physical, chart, labs and discussed the procedure including the risks, benefits and alternatives for the proposed anesthesia with the patient or authorized  representative who has indicated his/her understanding and acceptance.     Dental advisory given  Plan Discussed with: CRNA and Surgeon  Anesthesia Plan Comments:        Anesthesia Quick Evaluation

## 2022-12-19 NOTE — Discharge Instructions (Addendum)
You are being discharged to home.  Resume your previous diet.  We are waiting for your pathology results.  Your physician has recommended a repeat colonoscopy for surveillance based on pathology results.  Follow result of CT abdomen and pelvis with IV contrast. Take Lomotil as needed.

## 2022-12-21 ENCOUNTER — Encounter (HOSPITAL_COMMUNITY): Payer: Self-pay | Admitting: Gastroenterology

## 2022-12-31 ENCOUNTER — Other Ambulatory Visit: Payer: Self-pay | Admitting: Family Medicine

## 2023-01-07 ENCOUNTER — Ambulatory Visit (INDEPENDENT_AMBULATORY_CARE_PROVIDER_SITE_OTHER): Payer: Medicare Other | Admitting: Nurse Practitioner

## 2023-01-07 ENCOUNTER — Encounter: Payer: Self-pay | Admitting: Nurse Practitioner

## 2023-01-07 VITALS — BP 130/81 | HR 60 | Wt 190.4 lb

## 2023-01-07 DIAGNOSIS — K580 Irritable bowel syndrome with diarrhea: Secondary | ICD-10-CM | POA: Insufficient documentation

## 2023-01-07 DIAGNOSIS — M25552 Pain in left hip: Secondary | ICD-10-CM | POA: Diagnosis not present

## 2023-01-07 DIAGNOSIS — G252 Other specified forms of tremor: Secondary | ICD-10-CM

## 2023-01-07 NOTE — Patient Instructions (Signed)
Irritable Bowel Syndrome, Adult  Irritable bowel syndrome (IBS) is a group of symptoms that affects the organs responsible for digestion (gastrointestinal tract, or GI tract). IBS is not one specific disease. To regulate how the GI tract works, the body sends signals back and forth between the intestines and the brain. If you have IBS, there may be a problem with these signals. As a result, the GI tract does not function normally. The intestines may become more sensitive and overreact to certain things. This may be especially true when you eat certain foods or when you are under stress. There are four main types of IBS. These may be determined based on the consistency of your stool (feces): IBS with mostly (predominance of) diarrhea. IBS with predominance of constipation. IBS with mixed bowel habits. This includes both diarrhea and constipation. IBS unclassified. This includes IBS that cannot be categorized into one of the other three main types. It is important to know which type of IBS you have. Certain treatments are more likely to be helpful for certain types of IBS. What are the causes? The exact cause of IBS is not known. What increases the risk? You may have a higher risk for IBS if you: Are male. Are younger than 40 years. Have a family history of IBS. Have a mental health condition, such as depression, anxiety, or post-traumatic stress disorder. Have had a bacterial infection of your GI tract. What are the signs or symptoms? Symptoms of IBS vary from person to person. The main symptom is abdominal pain or discomfort. Other symptoms usually include one or more of the following: Diarrhea, constipation, or both. Swelling or bloating in the abdomen. Feeling full after eating a small or regular-sized meal. Frequent gas. Mucus in the stool. A feeling of having more stool left after a bowel movement. Symptoms tend to come and go. They may be triggered by stress, mental health  conditions, or certain foods. How is this diagnosed? This condition may be diagnosed based on a physical exam, your medical history, and your symptoms. You may have tests, such as: Blood tests. Stool test. Colonoscopy. This is a procedure in which your GI tract is viewed with a long, thin, flexible tube. How is this treated? There is no cure for IBS, but treatment can help relieve symptoms. Treatment depends on the type of IBS you have, and may include: Changes to your diet, such as: Avoiding foods that cause symptoms. Drinking more water. Following a low-FODMAP (fermentable oligosaccharides, disaccharides, monosaccharides, and polyols) diet for up to 6 weeks, or as told by your health care provider. FODMAPs are sugars that are hard for some people to digest. Eating more fiber. Eating small meals at the same times every day. Medicines. These may include: Fiber supplements, if you have constipation. Medicine to control diarrhea (antidiarrheal medicines). Medicine to help control muscle tightening (spasms) in your GI tract (antispasmodic medicines). Medicines to help with mental health conditions, such as antidepressants. Talk therapy or counseling. Working with a dietitian to help create a food plan that is right for you. Managing your stress. Follow these instructions at home: Eating and drinking  Eat a healthy diet. Eat 5-6 small meals a day. Try to eat meals at about the same times each day. Do not eat large meals. Gradually eat more fiber-rich foods. These include whole grains, fruits, and vegetables. This may be especially helpful if you have IBS with constipation. Eat a diet low in FODMAPs. You may need to avoid foods such as  citrus fruits, cabbage, garlic, and onions. Drink enough fluid to keep your urine pale yellow. Keep a journal of foods that seem to trigger symptoms. Avoid foods and drinks that: Contain added sugar. Make your symptoms worse. These may include dairy  products, caffeinated drinks, and carbonated drinks. Alcohol use Do not drink alcohol if: Your health care provider tells you not to drink. You are pregnant, may be pregnant, or are planning to become pregnant. If you drink alcohol: Limit how much you have to: 0-1 drink a day for women. 0-2 drinks a day for men. Know how much alcohol is in your drink. In the [place] one drink equals one 12 oz bottle of beer (355 mL), one 5 oz glass of wine (148 mL), or one 1 oz glass of hard liquor (44 mL) General instructions Take over-the-counter and prescription medicines only as told by your health care provider. This includes supplements. Get enough exercise. Do at least 150 minutes of moderate-intensity exercise each week. Manage your stress. Getting enough sleep and exercise can help you manage stress. Keep all follow-up visits. This is important. This includes all visits with your health care provider and therapist. Where to find more information International Foundation for Functional Gastrointestinal Disorders: aboutibs.[employer] of Diabetes and Digestive and Kidney Diseases: StageSync.si Contact a health care provider if: You have constant pain. You lose weight. You have diarrhea that gets worse. You have bleeding from the rectum. You vomit often. You have a fever. Get help right away if: You have severe abdominal pain. You have diarrhea with symptoms of dehydration, such as dizziness or dry mouth. You have bloody or black stools. You have severe abdominal bloating. You have vomiting that does not stop. You have blood in your vomit. Summary Irritable bowel syndrome (IBS) is not one specific disease. It is a group of symptoms that affects digestion. Your intestines may become more sensitive and overreact to certain things. This may be especially true when you eat certain foods or when you are under stress. There is no cure for IBS, but treatment can help relieve  symptoms. This information is not intended to replace advice given to you by your health care provider. Make sure you discuss any questions you have with your health care provider. Diet for Irritable Bowel Syndrome When you have irritable bowel syndrome (IBS), it is very important to follow the eating habits that are best for your condition. IBS may cause various symptoms, such as pain in the abdomen, constipation, or diarrhea. Choosing the right foods can help to ease the discomfort from these symptoms. Work with your health care provider and dietitian to find the eating plan that will help to control your symptoms. What are tips for following this plan?  Keep a food diary. This will help you identify foods that cause symptoms. Write down: What you eat and when you eat it. What symptoms you have. When symptoms occur in relation to your meals, such as "pain in abdomen 2 hours after dinner." Eat your meals slowly and in a relaxed setting. Aim to eat 5-6 small meals per day. Do not skip meals. Drink enough fluid to keep your urine pale yellow. Ask your health care provider if you should take an over-the-counter probiotic to help restore healthy bacteria in your gut (digestive tract). Probiotics are foods that contain good bacteria and yeasts. Your dietitian may have specific dietary recommendations for you based on your symptoms. Your dietitian may recommend that you: Avoid foods that cause symptoms.  Talk with your dietitian about other ways to get the same nutrients that are in those problem foods. Avoid foods with gluten. Gluten is a protein that is found in rye, wheat, and barley. Eat more foods that contain soluble fiber. Examples of foods with high soluble fiber include oats, seeds, and certain fruits and vegetables. Take a fiber supplement if told by your dietitian. Reduce or avoid certain foods called FODMAPs. These are foods that contain sugars that are hard for some people to digest. Ask  your health care provider which foods to avoid. What foods should I avoid? The following are some foods and drinks that may make your symptoms worse: Fatty foods, such as french fries. Foods that contain gluten, such as pasta and cereal. Dairy products, such as milk, cheese, and ice cream. Spicy foods. Alcohol. Products with caffeine, such as coffee, tea, or chocolate. Carbonated drinks, such as soda. Foods that are high in FODMAPs. These include certain fruits and vegetables. Products with sweeteners such as honey, high fructose corn syrup, sorbitol, and mannitol. The items listed above may not be a complete list of foods and beverages you should avoid. Contact a dietitian for more information. What foods are good sources of fiber? Your health care provider or dietitian may recommend that you eat more foods that contain fiber. Fiber can help to reduce constipation and other IBS symptoms. Add foods with fiber to your diet a little at a time so your body can get used to them. Too much fiber at one time might cause gas and swelling of your abdomen. The following are some foods that are good sources of fiber: Berries, such as raspberries, strawberries, and blueberries. Tomatoes. Carrots. Brown rice. Oats. Seeds, such as chia and pumpkin seeds. The items listed above may not be a complete list of recommended sources of fiber. Contact your dietitian for more options. Where to find more information International Foundation for Functional Gastrointestinal Disorders: aboutibs.[employer] of Diabetes and Digestive and Kidney Diseases: StageSync.si Summary When you have irritable bowel syndrome (IBS), it is very important to follow the eating habits that are best for your condition. IBS may cause various symptoms, such as pain in the abdomen, constipation, or diarrhea. Choosing the right foods can help to ease the discomfort that comes from symptoms. Your health care provider or  dietitian may recommend that you eat more foods that contain fiber. Keep a food diary. This will help you identify foods that cause symptoms. This information is not intended to replace advice given to you by your health care provider. Make sure you discuss any questions you have with your health care provider. Document Revised: 04/11/2021 Document Reviewed: 04/11/2021 Elsevier Patient Education  2024 [employer].

## 2023-01-07 NOTE — Progress Notes (Unsigned)
   Established Patient Office Visit  Subjective   Patient ID: Brandon Vega, male    DOB: 1951-04-13  Age: 72 y.o. MRN: [id number]   HPI Patient having GI issues. Patient states different testing were obtained by Dr Levon Hedger. Patient having hip pain for the past months. Patient states pain is 7/10 worse when laying down on left side.  Presents for recheck on his bowel issues.  Has a very complex history dating back a few years.  States it started after a missions [place] country.  First seen at our office for this issue on 02/15/2021.  Has had a full workup with Dr. Lorin Picket and his GI specialist.  Had a colonoscopy a couple of weeks ago.  States he has issues day and night.  Sometimes will have 1-2 stools at night which will sometimes wake him up with the urge to go to the bathroom.  Stools are formed but soft small to large in size.  Having frequent BMs during the day up to 8/day.  Some relief with Lomotil.  Has also tried probiotic yogurt.  Symptoms seem to have been worse over the past 6 months, cannot identify any specific triggers for worsening of his symptoms.  No fevers.  Stool normal color.  States he ate 2 chicken sandwiches recently and had constipation for 2 days which was unusual.  Denies acid reflux and heartburn symptoms. Patient also requesting a referral to orthopedic specialist for hip pain for the past 2 months. No complaints of occasional intention tremors worse in the right hand which is his dominant hand.  Began about 2 months ago.   Review of Systems  Constitutional:  Negative for chills and fever.  Respiratory:  Negative for cough.   Cardiovascular:  Negative for chest pain and leg swelling.  Gastrointestinal:  Positive for abdominal pain, constipation and diarrhea. Negative for blood in stool, heartburn, nausea and vomiting.  Neurological:  Positive for tremors. Negative for speech change.      Objective:   NAD.  Alert, oriented.  Calm cheerful affect.  Lungs  clear.  Heart regular rate rhythm.  Abdomen soft nondistended nontender.  No obvious masses.  No rebound or guarding.  Very faint tremor noted in the right hand mainly with intentional movements, was not constant. Today's Vitals   01/07/23 1137  BP: 130/81  Pulse: 60  SpO2: 97%  Weight: 190 lb 6.4 oz (86.4 kg)   Body mass index is 28.12 kg/m.  See multiple labs done from 10/11/2022 to 12/19/2022.  This also includes a normal TSH done on 11/22/2022.      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  If patient continues to have issues recommend recheck with his GI specialist.  Continue probiotics as directed and Lomotil sparingly if needed. Referred to orthopedic specialist per his request for left hip pain. Recommend more detailed workup for intention tremor. Return if symptoms worsen or fail to improve.

## 2023-01-08 ENCOUNTER — Encounter: Payer: Self-pay | Admitting: Nurse Practitioner

## 2023-01-08 DIAGNOSIS — G252 Other specified forms of tremor: Secondary | ICD-10-CM | POA: Insufficient documentation

## 2023-01-09 ENCOUNTER — Telehealth: Payer: Self-pay | Admitting: Nurse Practitioner

## 2023-01-09 NOTE — Telephone Encounter (Signed)
Patient was seen 8/26 and referred to specialist in [place] but he wants to see someone in [place] because its too far for him to drive

## 2023-01-09 NOTE — Telephone Encounter (Signed)
Patient called back before lunch and stated Dr. Marikay Alar has office in [place] this is who he wants to see. The number is [phone] for provider office

## 2023-01-10 NOTE — Telephone Encounter (Signed)
Nurses-this patient saw Brandon Vega for this issue Please explain to the patient I am trying to help but I need more information Is this consultation in regards to his hip or his back? Please verify with patient the Dr. Marikay Alar that he wants to see is that a neurosurgeon or an orthopedist? Please go ahead with referral to the specialist that he is requesting in [facility] with the phone number thank you

## 2023-01-13 NOTE — Telephone Encounter (Signed)
Nurses-I read Brandon Vega's note please connect with patient regarding my documentation in the phone note to you all a few days ago.  Let the patient know that we are trying to assist but so far were having some difficulty because we need more details thank you

## 2023-01-15 ENCOUNTER — Other Ambulatory Visit: Payer: Self-pay

## 2023-01-15 DIAGNOSIS — M25552 Pain in left hip: Secondary | ICD-10-CM

## 2023-01-15 NOTE — Telephone Encounter (Signed)
Patient referral sent to Dr Tia Alert [phone] after verifying information with patient .

## 2023-01-31 DIAGNOSIS — M4316 Spondylolisthesis, lumbar region: Secondary | ICD-10-CM | POA: Diagnosis not present

## 2023-02-12 DIAGNOSIS — M4316 Spondylolisthesis, lumbar region: Secondary | ICD-10-CM | POA: Diagnosis not present

## 2023-02-19 DIAGNOSIS — M4316 Spondylolisthesis, lumbar region: Secondary | ICD-10-CM | POA: Diagnosis not present

## 2023-03-04 DIAGNOSIS — M5416 Radiculopathy, lumbar region: Secondary | ICD-10-CM | POA: Diagnosis not present

## 2023-03-05 ENCOUNTER — Encounter (INDEPENDENT_AMBULATORY_CARE_PROVIDER_SITE_OTHER): Payer: Self-pay | Admitting: Gastroenterology

## 2023-03-05 ENCOUNTER — Ambulatory Visit (INDEPENDENT_AMBULATORY_CARE_PROVIDER_SITE_OTHER): Payer: Medicare Other | Admitting: Gastroenterology

## 2023-03-05 VITALS — BP 125/74 | HR 67 | Temp 97.8°F | Ht 69.0 in | Wt 190.1 lb

## 2023-03-05 DIAGNOSIS — K580 Irritable bowel syndrome with diarrhea: Secondary | ICD-10-CM

## 2023-03-05 DIAGNOSIS — R197 Diarrhea, unspecified: Secondary | ICD-10-CM

## 2023-03-05 DIAGNOSIS — R143 Flatulence: Secondary | ICD-10-CM | POA: Diagnosis not present

## 2023-03-05 MED ORDER — RIFAXIMIN 550 MG PO TABS
550.0000 mg | ORAL_TABLET | Freq: Three times a day (TID) | ORAL | 0 refills | Status: AC
Start: 2023-03-05 — End: 2023-03-19

## 2023-03-05 MED ORDER — RIFAXIMIN 550 MG PO TABS
550.0000 mg | ORAL_TABLET | Freq: Three times a day (TID) | ORAL | 0 refills | Status: DC
Start: 2023-03-05 — End: 2023-07-08

## 2023-03-05 MED ORDER — DIPHENOXYLATE-ATROPINE 2.5-0.025 MG PO TABS: 1.0000 | ORAL_TABLET | Freq: Four times a day (QID) | ORAL | 1 refills | Status: DC | PRN

## 2023-03-05 NOTE — Progress Notes (Addendum)
Referring Provider: Babs Sciara, MD Primary Care Physician:  Babs Sciara, MD Primary GI Physician: Dr. Levon Hedger   Chief Complaint  Patient presents with   Follow-up    Patient here today for a follow up. Patient says he is having 4-5 bm's per day, this is down from before since taking Diphenoxylate/Atropine 2.5 mg 1-2 tablets po Q 6 hours prn. Patient says he takes two per day.   HPI:   Brandon Vega is a 72 y.o. male with past medical history of CLL, erectile dysfunction   Patient presenting today for follow up of diarrhea  Last seen July 2024, at that time here for diarrhea since 2022, though worse over the past 4 months. 8 BMs per day. Bloating and pressure in his abdomen. Taking lomotil 2 tablets per day which was helping some. Waking from sleep to have a BM. Imodium nor pepto provided any improvement.   Recommended to check TSh, fecal fat/elastase, GI path panel and O&P x3, proceed with TCS I negative stool testing, continue lomotil 1-2 pills every 6 hours, avoid lactose.  All stool testing was negative, TSH 2.93. pt underwent colonoscopy in August  Present:  Taking lomotil one in the morning and one before bed. Will occasionally take 2 lomotil at once if going out to fish or doing something where he doesn't have access to a restroom. Having 4-5 BMs per day. Notes he is having some harder stool balls at times, some looser stools but no watery stools. No rectal bleeding or black. Still having to get up some in the middle of the night to have a BM. Denies any abdominal discomfort. He notes pretty regular BMs prior to onset of diarrhea, very occasional constipation at baseline if he ate more meats. He tries to eat a high fiber diet and does probiotic yogurt. No changes in appetite or weight loss.   He has a lot of gas, sometimes he feels he has to have a BM but just passing flatulence.   CT A/P on 12/14/22: Colonic diverticulosis, without radiographic evidence  of diverticulitis or other acute findings. Cholelithiasis. No radiographic evidence of cholecystitis. Last Colonoscopy:12/19/22 - One 4 mm polyp in the ascending colon, removed                            with a cold snare. Resected and retrieved.                           - Diverticulosis in the sigmoid colon and in the                            descending colon. Biopsied.                           - One 2 mm polyp in the rectum, removed with a cold                            snare. Resected and retrieved.                           - Non-bleeding internal hemorrhoids.  A. COLON, ASCENDING, POLYPECTOMY:       Tubular adenoma.       Negative for high-grade  dysplasia.   B. COLON, RANDOM, BIOPSY:       Colonic mucosa with no significant diagnostic alteration.       Negative for lymphocytic colitis or collagenous colitis.       Negative for activity, chronicity, granuloma, dysplasia or  malignancy.   C. RECTUM, POLYPECTOMY:       Hyperplastic polyp.       Negative for dysplasia  Last Endoscopy: never  Recommendations:  Repeat Colonoscopy 7 years   Past Medical History:  Diagnosis Date   Allergy    Aortic aneurysm (HCC) 10/11/2020   On ultrasound 3.6 cm May 2020 2 repeat again in approximately 20 months.  Patient advised to quit smoking.   ED (erectile dysfunction)     Past Surgical History:  Procedure Laterality Date   BIOPSY  12/19/2022   Procedure: BIOPSY;  Surgeon: Dolores Frame, MD;  [facility];  Service: Gastroenterology;;   COLONOSCOPY N/A 12/26/2017   Procedure: COLONOSCOPY;  Surgeon: Malissa Hippo, MD;  [facility];  Service: Endoscopy;  Laterality: N/A;  930   COLONOSCOPY WITH PROPOFOL N/A 12/19/2022   Procedure: COLONOSCOPY WITH PROPOFOL;  Surgeon: Dolores Frame, MD;  [facility];  Service: Gastroenterology;  Laterality: N/A;  9:00AM;ASA 1   POLYPECTOMY  12/19/2022   Procedure: POLYPECTOMY;  Surgeon: Dolores Frame, MD;  [facility];  Service: Gastroenterology;;   VASECTOMY      Current Outpatient Medications  Medication Sig Dispense Refill   amLODipine (NORVASC) 5 MG tablet TAKE 1 TABLET BY MOUTH DAILY. 30 tablet 3   diphenoxylate-atropine (LOMOTIL) 2.5-0.025 MG tablet TAKE 1 TO 2 TABLETS BY MOUTH EVERY 6 HOURS AS NEEDED FOR DIARRHEA 30 tablet 2   magnesium (MAGTAB) 84 MG ( ) TBCR SR tablet Take 84 mg by mouth daily.     Misc Natural Products (PROSTATE HEALTH PO) Take 2 tablets by mouth daily.     Multiple Vitamins-Minerals (CENTRUM SILVER PO) Take 1 tablet by mouth daily.     OVER THE COUNTER MEDICATION Take 1 tablet by mouth daily. neugenix     No current facility-administered medications for this visit.    Allergies as of 03/05/2023 - Review Complete 03/05/2023  Allergen Reaction Noted   Ace inhibitors  02/18/2018    Family History  Problem Relation Age of Onset   Cancer Mother        throat   Hypertension Father    Diabetes Father    Hyperlipidemia Father    Cancer Father        lung   Cancer Other        breast   Heart attack Other     Social History   Socioeconomic History   Marital status: Married    Spouse name: Not on file   Number of children: Not on file   Years of education: Not on file   Highest education level: Not on file  Occupational History   Not on file  Tobacco Use   Smoking status: Every Day    Current packs/day: 1.50    Types: Pipe, Cigarettes   Smokeless tobacco: Never  Vaping Use   Vaping status: Never Used  Substance and Sexual Activity   Alcohol use: Yes    Alcohol/week: 1.0 standard drink of alcohol    Types: 1 Glasses of wine per week   Drug use: Never   Sexual activity: Not on file  Other Topics Concern   Not on  file  Social History Narrative   Not on file   Social Determinants of Health   Financial Resource Strain: Not on file  Food Insecurity: Not on file  Transportation Needs: Not on file   Physical Activity: Not on file  Stress: Not on file  Social Connections: Not on file    Review of systems General: negative for malaise, night sweats, fever, chills, weight loss Neck: Negative for lumps, goiter, pain and significant neck swelling Resp: Negative for cough, wheezing, dyspnea at rest CV: Negative for chest pain, leg swelling, palpitations, orthopnea GI: denies melena, hematochezia, nausea, vomiting, constipation, dysphagia, odyonophagia, early satiety or unintentional weight loss. +loose stools +flatulence  MSK: Negative for joint pain or swelling, back pain, and muscle pain. Derm: Negative for itching or rash Psych: Denies depression, anxiety, memory loss, confusion. No homicidal or suicidal ideation.  Heme: Negative for prolonged bleeding, bruising easily, and swollen nodes. Endocrine: Negative for cold or heat intolerance, polyuria, polydipsia and goiter. Neuro: negative for tremor, gait imbalance, syncope and seizures. The remainder of the review of systems is noncontributory.  Physical Exam: BP 125/74 (BP Location: Right Arm, Patient Position: Sitting, Cuff Size: Large)   Pulse 67   Temp 97.8 F (36.6 C) (Temporal)   Ht 5\' 9"  (1.753 m)   Wt 190 lb 1.6 oz (86.2 kg)   BMI 28.07 kg/m  General:   Alert and oriented. No distress noted. Pleasant and cooperative.  Head:  Normocephalic and atraumatic. Eyes:  Conjuctiva clear without scleral icterus. Mouth:  Oral mucosa pink and moist. Good dentition. No lesions. Heart: Normal rate and rhythm, s1 and s2 heart sounds present.  Lungs: Clear lung sounds in all lobes. Respirations equal and unlabored. Abdomen:  +BS, soft, non-tender and non-distended. No rebound or guarding. No HSM or masses noted. Derm: No palmar erythema or jaundice Msk:  Symmetrical without gross deformities. Normal posture. Extremities:  Without edema. Neurologic:  Alert and  oriented x4 Psych:  Alert and cooperative. Normal mood and  affect.  Invalid input(s): "6 MONTHS"   ASSESSMENT: Brandon Vega is a 72 y.o. male presenting today for follow up of diarrhea  Workup for diarrhea has been unremarkable as labs and biopsies and colonoscopy were unrevealing for cause of his symptoms. CT A/P in August without findings to explain his symptoms. He is having 4-5 BMs per day on Lomotil twice daily, noting a mix of hard stool balls and looser stools still.  Still waking in the night some to defecate, reports a lot of flatulence as well.  Denies any rectal bleeding, melena, abdominal pain, nausea vomiting, weight loss.  It was thought previously that symptoms may be secondary to irritable bowel syndrome.  We will try course of Xifaxan to see if this helps his symptoms.  He can continue with Lomotil 1 to 2 tablets every 6 hours as needed for now.  Recommend good water intake and high-fiber diet, he can also try over-the-counter IBgard for flatulence.   PLAN:  Continue lomotil 1-2 tablets Q6h PRN  2. Good water intake, high fiber diet  3. Rx Xifaxan 550mg  TID (will also provide info for [facility] in case copay is high//PA not approved) 4. IB gard for flatulence  All questions were answered, patient verbalized understanding and is in agreement with plan as outlined above.    Follow Up: 4 months   Emilyn Ruble L. Jeanmarie Hubert, MSN, APRN, AGNP-C Adult-Gerontology Nurse Practitioner [facility]  I have reviewed the note and agree  with the APP's assessment as described in this progress note  [name], MD Gastroenterology and Hepatology [facility] Gastroenter[facility]

## 2023-03-05 NOTE — Patient Instructions (Addendum)
You can continue with lomotil 1-2 tablets every 6 hours as needed We will try to get xifaxan approved by your insurance to see if this helps treat your symptoms as well, we are also providing information of a pharmacy you can try to obtain this through on your own as it may be much cheaper  Continue with high fiber diet and plenty of water  Follow up 4 months  It was a pleasure to see you today. I want to create trusting relationships with patients and provide genuine, compassionate, and quality care. I truly value your feedback! please be on the lookout for a survey regarding your visit with me today. I appreciate your input about our visit and your time in completing this!    Trinette Vera L. Jeanmarie Hubert, MSN, APRN, AGNP-C Adult-Gerontology Nurse Practitioner [facility] Gastroenterology at [facility]

## 2023-04-02 DIAGNOSIS — M4316 Spondylolisthesis, lumbar region: Secondary | ICD-10-CM | POA: Diagnosis not present

## 2023-04-22 DIAGNOSIS — M47816 Spondylosis without myelopathy or radiculopathy, lumbar region: Secondary | ICD-10-CM | POA: Diagnosis not present

## 2023-04-25 ENCOUNTER — Telehealth: Payer: Self-pay

## 2023-04-25 NOTE — Telephone Encounter (Signed)
Prescription Request  04/25/2023  LOV: Visit date not found  What is the name of the medication or equipment? amLODipine (NORVASC) 5 MG tablet   Have you contacted your pharmacy to request a refill? Yes   Which pharmacy would you like this sent to?  [facility] [phone] - [place], [place] - [address] AT [facility] [address] [place] [address] Phone: [phone] Fax: [phone]    Patient notified that their request is being sent to the clinical staff for review and that they should receive a response within 2 business days.   Please advise at Mobile [phone] (mobile)

## 2023-05-13 ENCOUNTER — Inpatient Hospital Stay: Payer: Medicare Other

## 2023-05-13 ENCOUNTER — Inpatient Hospital Stay: Payer: Medicare Other | Admitting: Hematology

## 2023-05-23 ENCOUNTER — Inpatient Hospital Stay: Payer: Medicare Other | Attending: Hematology

## 2023-05-23 ENCOUNTER — Inpatient Hospital Stay: Payer: Medicare Other | Admitting: Hematology

## 2023-05-23 VITALS — BP 136/79 | HR 55 | Temp 97.8°F | Resp 16 | Wt 195.5 lb

## 2023-05-23 DIAGNOSIS — C9110 Chronic lymphocytic leukemia of B-cell type not having achieved remission: Secondary | ICD-10-CM

## 2023-05-23 LAB — COMPREHENSIVE METABOLIC PANEL
ALT: 22 U/L (ref 0–44)
AST: 27 U/L (ref 15–41)
Albumin: 3.9 g/dL (ref 3.5–5.0)
Alkaline Phosphatase: 53 U/L (ref 38–126)
Anion gap: 7 (ref 5–15)
BUN: 12 mg/dL (ref 8–23)
CO2: 27 mmol/L (ref 22–32)
Calcium: 9.3 mg/dL (ref 8.9–10.3)
Chloride: 102 mmol/L (ref 98–111)
Creatinine, Ser: 0.94 mg/dL (ref 0.61–1.24)
GFR, Estimated: >60 mL/min (ref >60)
Glucose, Bld: 87 mg/dL (ref 70–99)
Potassium: 4 mmol/L (ref 3.5–5.1)
Sodium: 136 mmol/L (ref 135–145)
Total Bilirubin: 1.8 mg/dL — ABNORMAL HIGH (ref 0.0–1.2)
Total Protein: 7.2 g/dL (ref 6.5–8.1)

## 2023-05-23 LAB — CBC WITH DIFFERENTIAL/PLATELET
Abs Immature Granulocytes: 0.03 10*3/uL (ref 0.00–0.07)
Basophils Absolute: 0.1 10*3/uL (ref 0.0–0.1)
Basophils Relative: 1 %
Eosinophils Absolute: 0.4 10*3/uL (ref 0.0–0.5)
Eosinophils Relative: 3 %
HCT: 44.6 % (ref 39.0–52.0)
Hemoglobin: 14.5 g/dL (ref 13.0–17.0)
Immature Granulocytes: 0 %
Lymphocytes Relative: 39 %
Lymphs Abs: 4.4 10*3/uL — ABNORMAL HIGH (ref 0.7–4.0)
MCH: 31.5 pg (ref 26.0–34.0)
MCHC: 32.5 g/dL (ref 30.0–36.0)
MCV: 97 fL (ref 80.0–100.0)
Monocytes Absolute: 0.7 10*3/uL (ref 0.1–1.0)
Monocytes Relative: 6 %
Neutro Abs: 5.8 10*3/uL (ref 1.7–7.7)
Neutrophils Relative %: 51 %
Platelets: 222 10*3/uL (ref 150–400)
RBC: 4.6 MIL/uL (ref 4.22–5.81)
RDW: 12.6 % (ref 11.5–15.5)
WBC: 11.4 10*3/uL — ABNORMAL HIGH (ref 4.0–10.5)
nRBC: 0 % (ref 0.0–0.2)

## 2023-05-23 LAB — LACTATE DEHYDROGENASE: LDH: 142 U/L (ref 98–192)

## 2023-05-23 NOTE — Progress Notes (Signed)
 [facility] [address] [place] [address]    Clinic Day:  05/23/2023  Referring physician: Alphonsa Vega LABOR, MD  Patient Care Team: Brandon Vega LABOR, MD as PCP - General (Family Medicine) Brandon Hai, MD as Consulting Physician (Hematology)   ASSESSMENT & PLAN:   Assessment: 1.  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 -Denies any fevers, night sweats or weight loss.  Denies any recurrent infections or hospitalizations. -Peripheral blood flow cytometry from 01/08/2019 showed CD5 positive monoclonal B-cell population, consistent with CLL.   2.  Smoking history: -Does not smoke cigarettes.  However smokes pipe every day for past 30+ years.  3. Family History: -Maternal uncle died of CLL at age 24. Mother and father had lung cancer.     Plan: 1.  Stage 0 CLL: - Denies any infections in the last 6 months.  He has occasional night sweats, not drenching type.  No significant weight loss or recurrent fevers. - Physical exam: No palpable adenopathy or splenomegaly. - Reviewed labs from 05/23/2023: Total bilirubin 1.8 and rest of LFTs are normal.  LDH normal.  White count is 11.4 and stable.  ALC is 4.4. - No indication for treatment at this time.  RTC 6 months for follow-up.    Orders Placed This Encounter  Procedures   CBC with Differential    Standing Status:   Future    Expected Date:   11/13/2023    Expiration Date:   05/22/2024   Comprehensive metabolic panel    Standing Status:   Future    Expected Date:   11/13/2023    Expiration Date:   05/22/2024   Lactate dehydrogenase    Standing Status:   Future    Expected Date:   11/13/2023    Expiration Date:   05/22/2024      LILLETTE Hummingbird R Teague,acting as a scribe for [name], MD.,have documented all relevant documentation on the behalf of [name], MD,as directed by  [name], MD while in the presence of [name], MD.  I, [name] MD, have reviewed the above documentation for accuracy and completeness, and I agree with the above.    [name], MD   1/9/20256:03 PM  CHIEF COMPLAINT:   Diagnosis: CLL    Cancer Staging  No matching staging information was found for the patient.    Prior Therapy: none  Current Therapy:  surveillance    HISTORY OF PRESENT ILLNESS:   Oncology History   No history exists.     INTERVAL HISTORY:   Brandon Vega is a 73 y.o. male presenting to clinic today for follow up of CLL. He was last seen by me on 11/07/22.  Since his last visit, he underwent a colonoscopy on 12/19/22 with Dr. Eartha. No dysplasia or malignancies were shown on pathology report.   Today, he states that he is doing well overall. His appetite level is at 100%. His energy level is at 0%.  He reports occasional mild night sweats. He denies any fevers, infections, or drenching night sweats. He has unexpectedly lost 3 or 4 pounds recently. He notes IBS symptoms of alternating diarrhea an constipation. He also reports pain in the right hip. He notes shaking in the right hand when using his hands.   PAST MEDICAL HISTORY:   Past Medical History: Past Medical History:  Diagnosis Date   Allergy    Aortic aneurysm (HCC) 10/11/2020   On ultrasound 3.6 cm May 2020 2  repeat again in approximately 20 months.  Patient advised to quit smoking.   ED (erectile dysfunction)     Surgical History: Past Surgical History:  Procedure Laterality Date   BIOPSY  12/19/2022   Procedure: BIOPSY;  Surgeon: Brandon Angelia Sieving, MD;  [facility];  Service: Gastroenterology;;   COLONOSCOPY N/A 12/26/2017   Procedure: COLONOSCOPY;  Surgeon: Brandon Claudis PENNER, MD;  [facility];  Service: Endoscopy;  Laterality: N/A;  930   COLONOSCOPY WITH PROPOFOL  N/A 12/19/2022   Procedure: COLONOSCOPY WITH PROPOFOL ;  Surgeon: Brandon Angelia Sieving, MD;  [facility];  Service:  Gastroenterology;  Laterality: N/A;  9:00AM;ASA 1   POLYPECTOMY  12/19/2022   Procedure: POLYPECTOMY;  Surgeon: Brandon Angelia Sieving, MD;  [facility];  Service: Gastroenterology;;   VASECTOMY      Social History: Social History   Socioeconomic History   Marital status: Married    Spouse name: Not on file   Number of children: Not on file   Years of education: Not on file   Highest education level: Not on file  Occupational History   Not on file  Tobacco Use   Smoking status: Every Day    Current packs/day: 1.50    Types: Pipe, Cigarettes   Smokeless tobacco: Never  Vaping Use   Vaping status: Never Used  Substance and Sexual Activity   Alcohol use: Yes    Alcohol/week: 1.0 standard drink of alcohol    Types: 1 Glasses of wine per week   Drug use: Never   Sexual activity: Not on file  Other Topics Concern   Not on file  Social History Narrative   Not on file   Social Drivers of Health   Financial Resource Strain: Not on file  Food Insecurity: Not on file  Transportation Needs: Not on file  Physical Activity: Not on file  Stress: Not on file  Social Connections: Not on file  Intimate Partner Violence: Not on file    Family History: Family History  Problem Relation Age of Onset   Cancer Mother        throat   Hypertension Father    Diabetes Father    Hyperlipidemia Father    Cancer Father        lung   Cancer Other        breast   Heart attack Other     Current Medications:  Current Outpatient Medications:    amLODipine  (NORVASC ) 5 MG tablet, TAKE 1 TABLET BY MOUTH DAILY., Disp: 30 tablet, Rfl: 3   diphenoxylate -atropine  (LOMOTIL ) 2.5-0.025 MG tablet, Take 1-2 tablets by mouth 4 (four) times daily as needed for diarrhea or loose stools. TAKE 1 TO 2 TABLETS BY MOUTH EVERY 6 HOURS AS NEEDED FOR DIARRHEA, Disp: 90 tablet, Rfl: 1   Misc Natural Products (PROSTATE HEALTH PO), Take 2 tablets by mouth daily., Disp: , Rfl:    Multiple  Vitamins-Minerals (CENTRUM SILVER PO), Take 1 tablet by mouth daily., Disp: , Rfl:    OVER THE COUNTER MEDICATION, Take 1 tablet by mouth daily. neugenix, Disp: , Rfl:    rifaximin  (XIFAXAN ) 550 MG TABS tablet, Take 1 tablet (550 mg total) by mouth 3 (three) times daily., Disp: 42 tablet, Rfl: 0   Allergies: Allergies  Allergen Reactions   Ace Inhibitors     cough    REVIEW OF SYSTEMS:   Review of Systems  Constitutional:  Negative for chills, fatigue and fever.       +  cold sweats at night  HENT:   Negative for lump/mass, mouth sores, nosebleeds, sore throat and trouble swallowing.   Eyes:  Negative for eye problems.  Respiratory:  Negative for cough and shortness of breath.   Cardiovascular:  Negative for chest pain, leg swelling and palpitations.  Gastrointestinal:  Positive for constipation and diarrhea. Negative for abdominal pain, nausea and vomiting.  Genitourinary:  Negative for bladder incontinence, difficulty urinating, dysuria, frequency, hematuria and nocturia.   Musculoskeletal:  Positive for arthralgias (in right hip, 6-7/10 severity). Negative for back pain, flank pain, myalgias and neck pain.  Skin:  Negative for itching and rash.  Neurological:  Positive for dizziness. Negative for headaches and numbness.       +tingling feet +shaking in right hand  Hematological:  Does not bruise/bleed easily.  Psychiatric/Behavioral:  Negative for depression, sleep disturbance and suicidal ideas. The patient is not nervous/anxious.   All other systems reviewed and are negative.    VITALS:   Blood pressure 136/79, pulse (!) 55, temperature 97.8 F (36.6 C), temperature source Oral, resp. rate 16, weight 195 lb 8.8 oz (88.7 kg), SpO2 95%.  Wt Readings from Last 3 Encounters:  05/23/23 195 lb 8.8 oz (88.7 kg)  03/05/23 190 lb 1.6 oz (86.2 kg)  01/07/23 190 lb 6.4 oz (86.4 kg)    Body mass index is 28.88 kg/m.  Performance status (ECOG): 1 - Symptomatic but completely  ambulatory  PHYSICAL EXAM:   Physical Exam Vitals and nursing note reviewed. Exam conducted with a chaperone present.  Constitutional:      Appearance: Normal appearance.  Cardiovascular:     Rate and Rhythm: Normal rate and regular rhythm.     Pulses: Normal pulses.     Heart sounds: Normal heart sounds.  Pulmonary:     Effort: Pulmonary effort is normal.     Breath sounds: Normal breath sounds.  Abdominal:     Palpations: Abdomen is soft. There is no hepatomegaly, splenomegaly or mass.     Tenderness: There is no abdominal tenderness.  Musculoskeletal:     Right lower leg: No edema.     Left lower leg: No edema.  Lymphadenopathy:     Cervical: No cervical adenopathy.     Right cervical: No superficial, deep or posterior cervical adenopathy.    Left cervical: No superficial, deep or posterior cervical adenopathy.     Upper Body:     Right upper body: No supraclavicular or axillary adenopathy.     Left upper body: No supraclavicular or axillary adenopathy.  Neurological:     General: No focal deficit present.     Mental Status: He is alert and oriented to person, place, and time.  Psychiatric:        Mood and Affect: Mood normal.        Behavior: Behavior normal.     LABS:      Latest Ref Rng & Units 05/23/2023    9:23 AM 11/07/2022   10:34 AM 10/11/2022   10:52 AM  CBC  WBC 4.0 - 10.5 K/uL 11.4  12.2  13.3   Hemoglobin 13.0 - 17.0 g/dL 85.4  85.0  84.5   Hematocrit 39.0 - 52.0 % 44.6  45.4  46.1   Platelets 150 - 400 K/uL 222  222  247       Latest Ref Rng & Units 05/23/2023    9:23 AM 11/07/2022   10:34 AM 10/11/2022   10:52 AM  CMP  Glucose 70 -  99 mg/dL 87  88    BUN 8 - 23 mg/dL 12  18    Creatinine 9.38 - 1.24 mg/dL 9.05  9.05    Sodium 864 - 145 mmol/L 136  137    Potassium 3.5 - 5.1 mmol/L 4.0  4.0    Chloride 98 - 111 mmol/L 102  106    CO2 22 - 32 mmol/L 27  23    Calcium  8.9 - 10.3 mg/dL 9.3  9.2    Total Protein 6.5 - 8.1 g/dL 7.2  7.2  7.3   Total  Bilirubin 0.0 - 1.2 mg/dL 1.8  1.9  1.9   Alkaline Phos 38 - 126 U/L 53  49  75   AST 15 - 41 U/L 27  26  27    ALT 0 - 44 U/L 22  24  20       No results found for: CEA1, CEA / No results found for: CEA1, CEA Lab Results  Component Value Date   PSA1 0.2 09/21/2020   No results found for: CAN199 No results found for: CAN125  No results found for: TOTALPROTELP, ALBUMINELP, A1GS, A2GS, BETS, BETA2SER, GAMS, MSPIKE, SPEI No results found for: TIBC, FERRITIN, IRONPCTSAT Lab Results  Component Value Date   LDH 142 05/23/2023   LDH 141 11/07/2022   LDH 133 05/02/2022     STUDIES:   No results found.

## 2023-05-23 NOTE — Patient Instructions (Addendum)

## 2023-05-28 DIAGNOSIS — M1612 Unilateral primary osteoarthritis, left hip: Secondary | ICD-10-CM | POA: Diagnosis not present

## 2023-06-04 DIAGNOSIS — M25552 Pain in left hip: Secondary | ICD-10-CM | POA: Diagnosis not present

## 2023-06-28 DIAGNOSIS — M7062 Trochanteric bursitis, left hip: Secondary | ICD-10-CM | POA: Diagnosis not present

## 2023-06-28 DIAGNOSIS — M1612 Unilateral primary osteoarthritis, left hip: Secondary | ICD-10-CM | POA: Diagnosis not present

## 2023-06-29 ENCOUNTER — Other Ambulatory Visit (INDEPENDENT_AMBULATORY_CARE_PROVIDER_SITE_OTHER): Payer: Self-pay | Admitting: Gastroenterology

## 2023-07-08 ENCOUNTER — Encounter (INDEPENDENT_AMBULATORY_CARE_PROVIDER_SITE_OTHER): Payer: Self-pay | Admitting: Gastroenterology

## 2023-07-08 ENCOUNTER — Ambulatory Visit (INDEPENDENT_AMBULATORY_CARE_PROVIDER_SITE_OTHER): Payer: Medicare Other | Admitting: Gastroenterology

## 2023-07-08 VITALS — BP 124/63 | HR 97 | Temp 98.1°F | Ht 69.0 in | Wt 188.0 lb

## 2023-07-08 DIAGNOSIS — R152 Fecal urgency: Secondary | ICD-10-CM | POA: Diagnosis not present

## 2023-07-08 DIAGNOSIS — R109 Unspecified abdominal pain: Secondary | ICD-10-CM

## 2023-07-08 DIAGNOSIS — R143 Flatulence: Secondary | ICD-10-CM

## 2023-07-08 DIAGNOSIS — R197 Diarrhea, unspecified: Secondary | ICD-10-CM | POA: Diagnosis not present

## 2023-07-08 DIAGNOSIS — K529 Noninfective gastroenteritis and colitis, unspecified: Secondary | ICD-10-CM

## 2023-07-08 DIAGNOSIS — R140 Abdominal distension (gaseous): Secondary | ICD-10-CM

## 2023-07-08 NOTE — Patient Instructions (Signed)
 Please continue with Lomotil at this time We will refer you to [facility] for the breath testing as discussed.  Follow-up 6 months.

## 2023-07-08 NOTE — Progress Notes (Signed)
 Referring Provider: Babs Sciara, MD Primary Care Physician:  Babs Sciara, MD Primary GI Physician: Dr. Levon Hedger   Chief Complaint  Patient presents with   Follow-up    Pt arrives for follow up. Pt states nothing has changed at this time.    HPI:   Brandon Vega is a 73 y.o. male with past medical history of CLL, erectile dysfunction     Patient presenting today for follow up of diarrhea   Patient last seen October 2024, at that time reports he is taking Lomotil twice daily, occasionally will take 2 Lomotil at once if going out to fish or doing something where he will be away from the restroom.  Having 4-5 BMs per day with some harder stool balls at times.  Denies any watery stools.  Try to do high-fiber diet and probiotic yogurt.    Recommend continue Lomotil 1 to 2 tablets every 6 hours as needed, good water intake, high-fiber diet, Rx for Xifaxan 550 mg 3 times daily x 14 days, IBgard for flatulence.  Present: States he never heard anything from his pharmacy regarding xifaxan. Feels symptoms are about the same as previously though he reports Saturday he had 8 BMs per day but they were not all diarrhea. Yesterday he only went twice but stools were solid, today he has already had 2 looser stools. He has a lot of gas pains still. He is passing a lot of gas. Not taking anything currently for this. Doing lomotil but only once per day. He has some bloating at times as well. No nausea or vomiting. He does feel that coffee and onions seem to make his gas and stomach cramping worse. He has a lot of fecal urgency as well.  Feels he has more solid stools than diarrhea. He feels that symptoms really impact his everyday life.    Previous workup: O&P x 3 in July 2024 were negative Pancreatic elastase and fecal fat in July 2024 within normal limits TSH 2.93 July 2024 Celiac testing - 10/11/2022 GI pathogen panel - July 2024  CT A/P on 12/14/22: Colonic diverticulosis, without  radiographic evidence of diverticulitis or other acute findings. Cholelithiasis. No radiographic evidence of cholecystitis. Last Colonoscopy:12/19/22 - One 4 mm polyp in the ascending colon, removed                            with a cold snare. Resected and retrieved.                           - Diverticulosis in the sigmoid colon and in the                            descending colon. Biopsied.                           - One 2 mm polyp in the rectum, removed with a cold                            snare. Resected and retrieved.                           - Non-bleeding internal hemorrhoids.  A. COLON, ASCENDING, POLYPECTOMY:  Tubular adenoma.       Negative for high-grade dysplasia.   B. COLON, RANDOM, BIOPSY:       Colonic mucosa with no significant diagnostic alteration.       Negative for lymphocytic colitis or collagenous colitis.       Negative for activity, chronicity, granuloma, dysplasia or  malignancy.   C. RECTUM, POLYPECTOMY:       Hyperplastic polyp.       Negative for dysplasia  Last Endoscopy: never   Recommendations:  Repeat Colonoscopy 7 years   Past Medical History:  Diagnosis Date   Allergy    Aortic aneurysm (HCC) 10/11/2020   On ultrasound 3.6 cm May 2020 2 repeat again in approximately 20 months.  Patient advised to quit smoking.   ED (erectile dysfunction)     Past Surgical History:  Procedure Laterality Date   BIOPSY  12/19/2022   Procedure: BIOPSY;  Surgeon: Dolores Frame, MD;  [facility];  Service: Gastroenterology;;   COLONOSCOPY N/A 12/26/2017   Procedure: COLONOSCOPY;  Surgeon: Malissa Hippo, MD;  [facility];  Service: Endoscopy;  Laterality: N/A;  930   COLONOSCOPY WITH PROPOFOL N/A 12/19/2022   Procedure: COLONOSCOPY WITH PROPOFOL;  Surgeon: Dolores Frame, MD;  [facility];  Service: Gastroenterology;  Laterality: N/A;  9:00AM;ASA 1   POLYPECTOMY  12/19/2022   Procedure:  POLYPECTOMY;  Surgeon: Dolores Frame, MD;  [facility];  Service: Gastroenterology;;   VASECTOMY      Current Outpatient Medications  Medication Sig Dispense Refill   amLODipine (NORVASC) 5 MG tablet TAKE 1 TABLET BY MOUTH DAILY. 30 tablet 3   diphenoxylate-atropine (LOMOTIL) 2.5-0.025 MG tablet TAKE 1-2 TABLETS BY MOUTH FOUR TIMES DAILY AS NEEDED FOR DIARRHEA OR LOOSE STOOLS 90 tablet 0   Misc Natural Products (PROSTATE HEALTH PO) Take 2 tablets by mouth daily.     Multiple Vitamins-Minerals (CENTRUM SILVER PO) Take 1 tablet by mouth daily.     OVER THE COUNTER MEDICATION Take 1 tablet by mouth daily. neugenix     No current facility-administered medications for this visit.    Allergies as of 07/08/2023 - Review Complete 07/08/2023  Allergen Reaction Noted   Ace inhibitors  02/18/2018    Family History  Problem Relation Age of Onset   Cancer Mother        throat   Hypertension Father    Diabetes Father    Hyperlipidemia Father    Cancer Father        lung   Cancer Other        breast   Heart attack Other     Social History   Socioeconomic History   Marital status: Married    Spouse name: Not on file   Number of children: Not on file   Years of education: Not on file   Highest education level: Not on file  Occupational History   Not on file  Tobacco Use   Smoking status: Every Day    Current packs/day: 1.50    Types: Pipe, Cigarettes   Smokeless tobacco: Never  Vaping Use   Vaping status: Never Used  Substance and Sexual Activity   Alcohol use: Yes    Alcohol/week: 1.0 standard drink of alcohol    Types: 1 Glasses of wine per week   Drug use: Never   Sexual activity: Not on file  Other Topics Concern   Not on file  Social History Narrative  Not on file   Social Drivers of Health   Financial Resource Strain: Not on file  Food Insecurity: Not on file  Transportation Needs: Not on file  Physical Activity: Not on file  Stress:  Not on file  Social Connections: Not on file    Review of systems General: negative for malaise, night sweats, fever, chills, weight loss Neck: Negative for lumps, goiter, pain and significant neck swelling Resp: Negative for cough, wheezing, dyspnea at rest CV: Negative for chest pain, leg swelling, palpitations, orthopnea GI: denies melena, hematochezia, nausea, vomiting,constipation, dysphagia, odyonophagia, early satiety or unintentional weight loss.  + Diarrhea + flatulence + bloating + abdominal cramping The remainder of the review of systems is noncontributory.  Physical Exam: BP 124/63   Temp 98.1 F (36.7 C)   Ht 5\' 9"  (1.753 m)   Wt 188 lb (85.3 kg)   BMI 27.76 kg/m  General:   Alert and oriented. No distress noted. Pleasant and cooperative.  Head:  Normocephalic and atraumatic. Eyes:  Conjuctiva clear without scleral icterus. Mouth:  Oral mucosa pink and moist. Good dentition. No lesions. Heart: Normal rate and rhythm, s1 and s2 heart sounds present.  Lungs: Clear lung sounds in all lobes. Respirations equal and unlabored. Abdomen:  +BS, soft, non-tender and non-distended. No rebound or guarding. No HSM or masses noted. Neurologic:  Alert and  oriented x4 Psych:  Alert and cooperative. Normal mood and affect.  Invalid input(s): "6 MONTHS"   ASSESSMENT: Brandon Vega is a 73 y.o. male presenting today for follow-up of diarrhea  Patient with ongoing diarrhea with extensive workup as outlined above.  Now having even more flatulence, bloating and abdominal cramping at times.  He is maintained on Lomotil once daily which seems to help with his diarrhea though he does note he can still have multiple bowel movements per day with fecal urgency.  At this time recommend proceeding with SIBO testing for further evaluation given his extensive flatulence/gas.  He can continue with Lomotil for now.   PLAN:  -referral for SIBO testing -avoid trigger foods -continue lomotil  PRN   All questions were answered, patient verbalized understanding and is in agreement with plan as outlined above.   Follow Up: 6 months  Briele Lagasse L. Jeanmarie Hubert, MSN, APRN, AGNP-C Adult-Gerontology Nurse Practitioner [facility]  I have reviewed the note and agree with the APP's assessment as described in this progress note  [name], MD Gastroenterology and Hepatology [facility] Gastroenterology

## 2023-07-17 DIAGNOSIS — K63829 Intestinal methanogen overgrowth, unspecified: Secondary | ICD-10-CM | POA: Diagnosis not present

## 2023-07-17 DIAGNOSIS — R197 Diarrhea, unspecified: Secondary | ICD-10-CM | POA: Diagnosis not present

## 2023-07-17 DIAGNOSIS — R140 Abdominal distension (gaseous): Secondary | ICD-10-CM | POA: Diagnosis not present

## 2023-07-24 ENCOUNTER — Encounter (INDEPENDENT_AMBULATORY_CARE_PROVIDER_SITE_OTHER): Payer: Self-pay

## 2023-07-24 ENCOUNTER — Other Ambulatory Visit (INDEPENDENT_AMBULATORY_CARE_PROVIDER_SITE_OTHER): Payer: Self-pay | Admitting: Gastroenterology

## 2023-07-24 MED ORDER — NEOMYCIN SULFATE 500 MG PO TABS: 500.0000 mg | ORAL_TABLET | Freq: Two times a day (BID) | ORAL | 0 refills | Status: AC

## 2023-07-24 MED ORDER — RIFAXIMIN 550 MG PO TABS: 550.0000 mg | ORAL_TABLET | Freq: Three times a day (TID) | ORAL | 0 refills | Status: DC

## 2023-07-26 ENCOUNTER — Other Ambulatory Visit (INDEPENDENT_AMBULATORY_CARE_PROVIDER_SITE_OTHER): Payer: Self-pay | Admitting: Gastroenterology

## 2023-07-29 ENCOUNTER — Other Ambulatory Visit (INDEPENDENT_AMBULATORY_CARE_PROVIDER_SITE_OTHER): Payer: Self-pay

## 2023-07-29 ENCOUNTER — Telehealth (INDEPENDENT_AMBULATORY_CARE_PROVIDER_SITE_OTHER): Payer: Self-pay

## 2023-07-29 DIAGNOSIS — K63829 Intestinal methanogen overgrowth, unspecified: Secondary | ICD-10-CM

## 2023-07-29 MED ORDER — RIFAXIMIN 550 MG PO TABS
550.0000 mg | ORAL_TABLET | Freq: Three times a day (TID) | ORAL | 0 refills | Status: DC
Start: 2023-07-29 — End: 2023-11-21

## 2023-07-29 NOTE — Telephone Encounter (Signed)
 Patient called to report he was able to pick up the neomycin, but he can not afford the Xifaxan as it cost $2,300.00. He has not taken any of the Neomycin as he was instructed to take the both together. Please advise.

## 2023-07-29 NOTE — Telephone Encounter (Signed)
 I spoke with the patient and made him aware that he could get the Xifaxan 550 mg # 50 tablets through [employer] [facility] in [place]. I gave him the phone number of [phone], I asked that he call them and get this set up to be delivered to his home. I made him aware that # 50 tablets would cost him $90.00, and he would need to take one tid for fourteen days which will be a total of # 42, he is aware the shipment from [facility] will have number 50 tablets, that he would have eight tablets left over. He is aware to take this with the neomycin, once he gets this shipment. He is aware, I have placed the printed script for the Xifaxan for him to pick up from the front some time this week.

## 2023-08-02 ENCOUNTER — Other Ambulatory Visit: Payer: Self-pay

## 2023-08-02 MED ORDER — AMLODIPINE BESYLATE 5 MG PO TABS: 5.0000 mg | ORAL_TABLET | Freq: Every day | ORAL | 3 refills | Status: DC

## 2023-08-09 DIAGNOSIS — M1612 Unilateral primary osteoarthritis, left hip: Secondary | ICD-10-CM | POA: Diagnosis not present

## 2023-08-09 DIAGNOSIS — M7062 Trochanteric bursitis, left hip: Secondary | ICD-10-CM | POA: Diagnosis not present

## 2023-09-10 ENCOUNTER — Telehealth (INDEPENDENT_AMBULATORY_CARE_PROVIDER_SITE_OTHER): Payer: Self-pay | Admitting: Gastroenterology

## 2023-09-10 NOTE — Telephone Encounter (Signed)
 Pt called with questions about his medication and causing him to have diarrhea. He doesn't know the name of the medicine, he was outside. I told him I wasn't the nurse and I would have her call him at [phone]

## 2023-09-10 NOTE — Telephone Encounter (Signed)
 Patient states he just got the xifiaxan from [place] and started it yesterday with the neomycin . He said he had two episodes of diarrhea yesterday and one today. He wonders if the medication is causing it. He said he was taking anti diarrhea medication one time a day but stopped when he started this med. Should he restart the anitdiarrhea med while he is taking xifiaxan and neomycin . ( Not sure if he was told to stop or why he stopped it)

## 2023-09-11 NOTE — Telephone Encounter (Signed)
 Discussed with patient per chelsea - the neomycin  can cause diarrhea, he is fine to take imodium as needed  Patient verbalized understanding.

## 2023-09-21 ENCOUNTER — Other Ambulatory Visit (INDEPENDENT_AMBULATORY_CARE_PROVIDER_SITE_OTHER): Payer: Self-pay | Admitting: Gastroenterology

## 2023-09-23 NOTE — Telephone Encounter (Signed)
 Last ov 06/2023. Note states continue lomotil 

## 2023-09-24 NOTE — Addendum Note (Signed)
 Addended by: Myrna Ast on: 09/24/2023 03:29 PM   Modules accepted: Orders

## 2023-10-04 DIAGNOSIS — Z13220 Encounter for screening for lipoid disorders: Secondary | ICD-10-CM | POA: Diagnosis not present

## 2023-10-04 DIAGNOSIS — I10 Essential (primary) hypertension: Secondary | ICD-10-CM | POA: Diagnosis not present

## 2023-10-04 DIAGNOSIS — Z0000 Encounter for general adult medical examination without abnormal findings: Secondary | ICD-10-CM | POA: Diagnosis not present

## 2023-10-04 DIAGNOSIS — Z79899 Other long term (current) drug therapy: Secondary | ICD-10-CM | POA: Diagnosis not present

## 2023-10-05 LAB — COMPREHENSIVE METABOLIC PANEL WITH GFR
ALT: 20 [IU]/L (ref 0–44)
AST: 27 [IU]/L (ref 0–40)
Albumin: 4.1 g/dL (ref 3.8–4.8)
Alkaline Phosphatase: 66 [IU]/L (ref 44–121)
BUN/Creatinine Ratio: 16 (ref 10–24)
BUN: 14 mg/dL (ref 8–27)
Bilirubin Total: 1.7 mg/dL — ABNORMAL HIGH (ref 0.0–1.2)
CO2: 23 mmol/L (ref 20–29)
Calcium: 9.2 mg/dL (ref 8.6–10.2)
Chloride: 105 mmol/L (ref 96–106)
Creatinine, Ser: 0.85 mg/dL (ref 0.76–1.27)
Globulin, Total: 2.4 g/dL (ref 1.5–4.5)
Glucose: 87 mg/dL (ref 70–99)
Potassium: 4.5 mmol/L (ref 3.5–5.2)
Sodium: 142 mmol/L (ref 134–144)
Total Protein: 6.5 g/dL (ref 6.0–8.5)
eGFR: 91 mL/min/{1.73_m2}

## 2023-10-05 LAB — LIPID PANEL
Chol/HDL Ratio: 4 ratio (ref 0.0–5.0)
Cholesterol, Total: 182 mg/dL (ref 100–199)
HDL: 45 mg/dL
LDL Chol Calc (NIH): 122 mg/dL — ABNORMAL HIGH (ref 0–99)
Triglycerides: 82 mg/dL (ref 0–149)
VLDL Cholesterol Cal: 15 mg/dL (ref 5–40)

## 2023-10-05 LAB — PSA: Prostate Specific Ag, Serum: 0.2 ng/mL (ref 0.0–4.0)

## 2023-11-13 ENCOUNTER — Inpatient Hospital Stay: Payer: Medicare Other

## 2023-11-14 ENCOUNTER — Inpatient Hospital Stay: Attending: Hematology

## 2023-11-14 DIAGNOSIS — Z79899 Other long term (current) drug therapy: Secondary | ICD-10-CM | POA: Diagnosis not present

## 2023-11-14 DIAGNOSIS — Z801 Family history of malignant neoplasm of trachea, bronchus and lung: Secondary | ICD-10-CM | POA: Diagnosis not present

## 2023-11-14 DIAGNOSIS — C9110 Chronic lymphocytic leukemia of B-cell type not having achieved remission: Secondary | ICD-10-CM | POA: Insufficient documentation

## 2023-11-14 DIAGNOSIS — Z87891 Personal history of nicotine dependence: Secondary | ICD-10-CM | POA: Diagnosis not present

## 2023-11-14 DIAGNOSIS — Z806 Family history of leukemia: Secondary | ICD-10-CM | POA: Insufficient documentation

## 2023-11-14 LAB — CBC WITH DIFFERENTIAL/PLATELET
Abs Immature Granulocytes: 0.02 10*3/uL (ref 0.00–0.07)
Basophils Absolute: 0.1 10*3/uL (ref 0.0–0.1)
Basophils Relative: 1 %
Eosinophils Absolute: 0.4 10*3/uL (ref 0.0–0.5)
Eosinophils Relative: 3 %
HCT: 46.1 % (ref 39.0–52.0)
Hemoglobin: 15 g/dL (ref 13.0–17.0)
Immature Granulocytes: 0 %
Lymphocytes Relative: 40 %
Lymphs Abs: 5 10*3/uL — ABNORMAL HIGH (ref 0.7–4.0)
MCH: 31.5 pg (ref 26.0–34.0)
MCHC: 32.5 g/dL (ref 30.0–36.0)
MCV: 96.8 fL (ref 80.0–100.0)
Monocytes Absolute: 0.9 10*3/uL (ref 0.1–1.0)
Monocytes Relative: 7 %
Neutro Abs: 6.3 10*3/uL (ref 1.7–7.7)
Neutrophils Relative %: 49 %
Platelets: 231 10*3/uL (ref 150–400)
RBC: 4.76 MIL/uL (ref 4.22–5.81)
RDW: 12.8 % (ref 11.5–15.5)
WBC: 12.6 10*3/uL — ABNORMAL HIGH (ref 4.0–10.5)
nRBC: 0 % (ref 0.0–0.2)

## 2023-11-14 LAB — COMPREHENSIVE METABOLIC PANEL WITH GFR
ALT: 19 U/L (ref 0–44)
AST: 26 U/L (ref 15–41)
Albumin: 3.9 g/dL (ref 3.5–5.0)
Alkaline Phosphatase: 56 U/L (ref 38–126)
Anion gap: 10 (ref 5–15)
BUN: 17 mg/dL (ref 8–23)
CO2: 24 mmol/L (ref 22–32)
Calcium: 9.7 mg/dL (ref 8.9–10.3)
Chloride: 105 mmol/L (ref 98–111)
Creatinine, Ser: 1 mg/dL (ref 0.61–1.24)
GFR, Estimated: >60 mL/min (ref >60)
Glucose, Bld: 101 mg/dL — ABNORMAL HIGH (ref 70–99)
Potassium: 4.3 mmol/L (ref 3.5–5.1)
Sodium: 139 mmol/L (ref 135–145)
Total Bilirubin: 1.6 mg/dL — ABNORMAL HIGH (ref 0.0–1.2)
Total Protein: 7.2 g/dL (ref 6.5–8.1)

## 2023-11-14 LAB — LACTATE DEHYDROGENASE: LDH: 140 U/L (ref 98–192)

## 2023-11-20 ENCOUNTER — Inpatient Hospital Stay: Payer: Medicare Other | Admitting: Hematology

## 2023-11-20 DIAGNOSIS — M7062 Trochanteric bursitis, left hip: Secondary | ICD-10-CM | POA: Diagnosis not present

## 2023-11-20 DIAGNOSIS — M1612 Unilateral primary osteoarthritis, left hip: Secondary | ICD-10-CM | POA: Diagnosis not present

## 2023-11-21 ENCOUNTER — Inpatient Hospital Stay: Admitting: Hematology

## 2023-11-21 VITALS — BP 113/64 | HR 52 | Temp 98.1°F | Resp 18 | Ht 69.0 in | Wt 187.0 lb

## 2023-11-21 DIAGNOSIS — Z801 Family history of malignant neoplasm of trachea, bronchus and lung: Secondary | ICD-10-CM | POA: Diagnosis not present

## 2023-11-21 DIAGNOSIS — Z79899 Other long term (current) drug therapy: Secondary | ICD-10-CM | POA: Diagnosis not present

## 2023-11-21 DIAGNOSIS — C9110 Chronic lymphocytic leukemia of B-cell type not having achieved remission: Secondary | ICD-10-CM | POA: Diagnosis not present

## 2023-11-21 DIAGNOSIS — Z87891 Personal history of nicotine dependence: Secondary | ICD-10-CM | POA: Diagnosis not present

## 2023-11-21 DIAGNOSIS — Z806 Family history of leukemia: Secondary | ICD-10-CM | POA: Diagnosis not present

## 2023-11-21 NOTE — Patient Instructions (Addendum)

## 2023-11-21 NOTE — Progress Notes (Signed)
 [facility] [address], [place] [address]    Clinic Day:  11/21/2023  Referring physician: Alphonsa Glendia LABOR, MD  Patient Care Team: Alphonsa Glendia LABOR, MD as PCP - General (Family Medicine) Rogers Hai, MD as Consulting Physician (Hematology)   ASSESSMENT & PLAN:   Assessment: 1.  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 -Denies any fevers, night sweats or weight loss.  Denies any recurrent infections or hospitalizations. -Peripheral blood flow cytometry from 01/08/2019 showed CD5 positive monoclonal B-cell population, consistent with CLL.   2.  Smoking history: -Does not smoke cigarettes.  However smokes pipe every day for past 30+ years.  3. Family History: -Maternal uncle died of CLL at age 54. Mother and father had lung cancer.     Plan: 1.  Stage 0 CLL: - Denies any fevers, or weight loss.  He has occasional night sweats, once per month living below weight. - He was recently diagnosed with SIBO and was reportedly treated for it.  Also suffers from left hip bursitis which is new.  He reports fatigue associated with them. - Labs from 11/14/2023: Normal LFTs and creatinine.  LDH normal.  CBC shows white count stable at 12.6.  Normal hemoglobin and platelet count. - No indication for treatment at this time.  RTC 6 months for follow-up.  Will check CLL FISH panel, IgH mutation and TP53 mutation prior to next visit.    Orders Placed This Encounter  Procedures   CBC with Differential    Standing Status:   Future    Expected Date:   05/18/2024    Expiration Date:   08/16/2024   Comprehensive metabolic panel    Standing Status:   Future    Expected Date:   05/18/2024    Expiration Date:   08/16/2024   Lactate dehydrogenase    Standing Status:   Future    Expected Date:   05/18/2024    Expiration Date:   08/16/2024   Chronic Lymphocytic Leukemia (CLL) Profile, FISH    Standing Status:   Future    Expected Date:   05/18/2024     Expiration Date:   08/16/2024   IgVH Somatic Hypermutation    Standing Status:   Future    Expected Date:   05/18/2024    Expiration Date:   08/16/2024   Miscellaneous [facility] test (send-out)    Standing Status:   Future    Expected Date:   05/18/2024    Expiration Date:   08/16/2024    Test name / description::   TP53 mutation: Test code - [id number]      I,Helena R Teague,acting as a scribe for Hai Rogers, MD.,have documented all relevant documentation on the behalf of Hai Rogers, MD,as directed by  Hai Rogers, MD while in the presence of Hai Rogers, MD.  I, Hai Rogers MD, have reviewed the above documentation for accuracy and completeness, and I agree with the above.     Hai Rogers, MD   7/10/20254:49 PM  CHIEF COMPLAINT:   Diagnosis: CLL    Cancer Staging  No matching staging information was found for the patient.    Prior Therapy: none  Current Therapy:  surveillance    HISTORY OF PRESENT ILLNESS:   Oncology History   No history exists.     INTERVAL HISTORY:   Brandon Vega is a 73 y.o. male presenting to clinic today for follow up of CLL. He was last seen by me on 05/23/23.  Today, he states that he is doing well overall. His appetite level is at 100%. His energy level is at 25%. Levent reports fatigue. He notes bursitis in his bilateral hips that is causing pain and a SIBO in his intestines causing diarrhea. He was put on a medication after SIBO diagnosis and has noticed no improvement of diarrhea. He has not been called back by [facility], who diagnosed his SIBO. He denies any infections, fevers, or unintentional weight loss in the last 6 months. He notes non-drenching night sweats once a month and a heathy appetite.   PAST MEDICAL HISTORY:   Past Medical History: Past Medical History:  Diagnosis Date   Allergy    Aortic aneurysm (HCC) 10/11/2020   On ultrasound 3.6 cm May 2020 2 repeat again in approximately 20 months.   Patient advised to quit smoking.   ED (erectile dysfunction)     Surgical History: Past Surgical History:  Procedure Laterality Date   BIOPSY  12/19/2022   Procedure: BIOPSY;  Surgeon: Eartha Angelia Sieving, MD;  [facility];  Service: Gastroenterology;;   COLONOSCOPY N/A 12/26/2017   Procedure: COLONOSCOPY;  Surgeon: Golda Claudis PENNER, MD;  [facility];  Service: Endoscopy;  Laterality: N/A;  930   COLONOSCOPY WITH PROPOFOL  N/A 12/19/2022   Procedure: COLONOSCOPY WITH PROPOFOL ;  Surgeon: Eartha Angelia Sieving, MD;  [facility];  Service: Gastroenterology;  Laterality: N/A;  9:00AM;ASA 1   POLYPECTOMY  12/19/2022   Procedure: POLYPECTOMY;  Surgeon: Eartha Angelia Sieving, MD;  [facility];  Service: Gastroenterology;;   VASECTOMY      Social History: Social History   Socioeconomic History   Marital status: Married    Spouse name: Not on file   Number of children: Not on file   Years of education: Not on file   Highest education level: Not on file  Occupational History   Not on file  Tobacco Use   Smoking status: Every Day    Current packs/day: 1.50    Types: Pipe, Cigarettes   Smokeless tobacco: Never  Vaping Use   Vaping status: Never Used  Substance and Sexual Activity   Alcohol use: Yes    Alcohol/week: 1.0 standard drink of alcohol    Types: 1 Glasses of wine per week   Drug use: Never   Sexual activity: Not on file  Other Topics Concern   Not on file  Social History Narrative   Not on file   Social Drivers of Health   Financial Resource Strain: Not on file  Food Insecurity: Not on file  Transportation Needs: Not on file  Physical Activity: Not on file  Stress: Not on file  Social Connections: Not on file  Intimate Partner Violence: Not on file    Family History: Family History  Problem Relation Age of Onset   Cancer Mother        throat   Hypertension Father    Diabetes Father    Hyperlipidemia  Father    Cancer Father        lung   Cancer Other        breast   Heart attack Other     Current Medications:  Current Outpatient Medications:    amLODipine  (NORVASC ) 5 MG tablet, Take 1 tablet (5 mg total) by mouth daily., Disp: 30 tablet, Rfl: 3   diphenoxylate -atropine  (LOMOTIL ) 2.5-0.025 MG tablet, TAKE 1 TO 2 TABLETS BY MOUTH FOUR TIMES DAILY AS NEEDED FOR DIARRHEA OR  LOOSE STOOLS, Disp: 90 tablet, Rfl: 1   Misc Natural Products (PROSTATE HEALTH PO), Take 2 tablets by mouth daily., Disp: , Rfl:    Multiple Vitamins-Minerals (CENTRUM SILVER PO), Take 1 tablet by mouth daily., Disp: , Rfl:    OVER THE COUNTER MEDICATION, Take 1 tablet by mouth daily. neugenix, Disp: , Rfl:    Allergies: Allergies  Allergen Reactions   Ace Inhibitors     cough    REVIEW OF SYSTEMS:   Review of Systems  Constitutional:  Positive for fatigue. Negative for chills and fever.  HENT:   Negative for lump/mass, mouth sores, nosebleeds, sore throat and trouble swallowing.   Eyes:  Negative for eye problems.  Respiratory:  Negative for cough and shortness of breath.   Cardiovascular:  Negative for chest pain, leg swelling and palpitations.  Gastrointestinal:  Positive for abdominal pain (6/10 severity) and diarrhea. Negative for constipation, nausea and vomiting.  Genitourinary:  Positive for nocturia. Negative for bladder incontinence, difficulty urinating, dysuria, frequency and hematuria.   Musculoskeletal:  Negative for arthralgias, back pain, flank pain, myalgias and neck pain.       +Bilateral hip pain, 6/10 severity  Skin:  Negative for itching and rash.  Neurological:  Negative for dizziness, headaches and numbness.  Hematological:  Does not bruise/bleed easily.  Psychiatric/Behavioral:  Negative for depression, sleep disturbance and suicidal ideas. The patient is not nervous/anxious.   All other systems reviewed and are negative.    VITALS:   Blood pressure 113/64, pulse (!) 52,  temperature 98.1 F (36.7 C), temperature source Tympanic, resp. rate 18, height 5' 9 (1.753 m), weight 187 lb (84.8 kg), SpO2 97%.  Wt Readings from Last 3 Encounters:  11/21/23 187 lb (84.8 kg)  07/08/23 188 lb (85.3 kg)  05/23/23 195 lb 8.8 oz (88.7 kg)    Body mass index is 27.62 kg/m.  Performance status (ECOG): 1 - Symptomatic but completely ambulatory  PHYSICAL EXAM:   Physical Exam Vitals and nursing note reviewed. Exam conducted with a chaperone present.  Constitutional:      Appearance: Normal appearance.  Cardiovascular:     Rate and Rhythm: Normal rate and regular rhythm.     Pulses: Normal pulses.     Heart sounds: Normal heart sounds.  Pulmonary:     Effort: Pulmonary effort is normal.     Breath sounds: Normal breath sounds.  Abdominal:     Palpations: Abdomen is soft. There is no hepatomegaly, splenomegaly or mass.     Tenderness: There is no abdominal tenderness.  Musculoskeletal:     Right lower leg: No edema.     Left lower leg: No edema.  Lymphadenopathy:     Cervical: No cervical adenopathy.     Right cervical: No superficial, deep or posterior cervical adenopathy.    Left cervical: No superficial, deep or posterior cervical adenopathy.     Upper Body:     Right upper body: No supraclavicular or axillary adenopathy.     Left upper body: No supraclavicular or axillary adenopathy.  Neurological:     General: No focal deficit present.     Mental Status: He is alert and oriented to person, place, and time.  Psychiatric:        Mood and Affect: Mood normal.        Behavior: Behavior normal.     LABS:      Latest Ref Rng & Units 11/14/2023    8:11 AM 05/23/2023    9:23 AM  11/07/2022   10:34 AM  CBC  WBC 4.0 - 10.5 K/uL 12.6  11.4  12.2   Hemoglobin 13.0 - 17.0 g/dL 84.9  85.4  85.0   Hematocrit 39.0 - 52.0 % 46.1  44.6  45.4   Platelets 150 - 400 K/uL 231  222  222       Latest Ref Rng & Units 11/14/2023    8:11 AM 10/04/2023    9:01 AM 05/23/2023     9:23 AM  CMP  Glucose 70 - 99 mg/dL 898  87  87   BUN 8 - 23 mg/dL 17  14  12    Creatinine 0.61 - 1.24 mg/dL 8.99  9.14  9.05   Sodium 135 - 145 mmol/L 139  142  136   Potassium 3.5 - 5.1 mmol/L 4.3  4.5  4.0   Chloride 98 - 111 mmol/L 105  105  102   CO2 22 - 32 mmol/L 24  23  27    Calcium 8.9 - 10.3 mg/dL 9.7  9.2  9.3   Total Protein 6.5 - 8.1 g/dL 7.2  6.5  7.2   Total Bilirubin 0.0 - 1.2 mg/dL 1.6  1.7  1.8   Alkaline Phos 38 - 126 U/L 56  66  53   AST 15 - 41 U/L 26  27  27    ALT 0 - 44 U/L 19  20  22       No results found for: CEA1, CEA / No results found for: CEA1, CEA Lab Results  Component Value Date   PSA1 0.2 10/04/2023   No results found for: CAN199 No results found for: CAN125  No results found for: TOTALPROTELP, ALBUMINELP, A1GS, A2GS, BETS, BETA2SER, GAMS, MSPIKE, SPEI No results found for: TIBC, FERRITIN, IRONPCTSAT Lab Results  Component Value Date   LDH 140 11/14/2023   LDH 142 05/23/2023   LDH 141 11/07/2022     STUDIES:   No results found.

## 2023-12-03 ENCOUNTER — Other Ambulatory Visit: Payer: Self-pay

## 2023-12-03 MED ORDER — AMLODIPINE BESYLATE 5 MG PO TABS: 5.0000 mg | ORAL_TABLET | Freq: Every day | ORAL | 3 refills | Status: DC

## 2023-12-20 ENCOUNTER — Encounter (INDEPENDENT_AMBULATORY_CARE_PROVIDER_SITE_OTHER): Payer: Self-pay | Admitting: Gastroenterology

## 2023-12-30 ENCOUNTER — Ambulatory Visit (INDEPENDENT_AMBULATORY_CARE_PROVIDER_SITE_OTHER): Admitting: Gastroenterology

## 2023-12-30 ENCOUNTER — Encounter (INDEPENDENT_AMBULATORY_CARE_PROVIDER_SITE_OTHER): Payer: Self-pay | Admitting: Gastroenterology

## 2023-12-30 VITALS — BP 97/60 | HR 65 | Temp 98.3°F | Ht 69.0 in | Wt 187.8 lb

## 2023-12-30 DIAGNOSIS — R143 Flatulence: Secondary | ICD-10-CM

## 2023-12-30 DIAGNOSIS — K63829 Intestinal methanogen overgrowth, unspecified: Secondary | ICD-10-CM | POA: Diagnosis not present

## 2023-12-30 DIAGNOSIS — K529 Noninfective gastroenteritis and colitis, unspecified: Secondary | ICD-10-CM

## 2023-12-30 MED ORDER — NEOMYCIN SULFATE 500 MG PO TABS: 500.0000 mg | ORAL_TABLET | Freq: Two times a day (BID) | ORAL | 0 refills | Status: AC

## 2023-12-30 MED ORDER — RIFAXIMIN 550 MG PO TABS: 550.0000 mg | ORAL_TABLET | Freq: Three times a day (TID) | ORAL | 0 refills | Status: AC

## 2023-12-30 NOTE — Progress Notes (Signed)
 Referring Provider: Alphonsa Glendia LABOR, MD Primary Care Physician:  Alphonsa Glendia LABOR, MD Primary GI Physician: Dr. Eartha   Chief Complaint  Patient presents with   Follow-up    Pt arrives for follow up. Pt states procedure done at [facility] did not help. Pt states the only thing that helps slow diarrhea is the anti diarrheal Dr.Scott gave him.    HPI:   NORFLEET CAPERS is a 73 y.o. male with past medical history of CLL, ED, SIMO  Patient presenting today for: Follow up of diarrhea, frequent stools and SIMO  Last seen feb 2025, at that time, had not gotten xifaxan  course that was sent. Still having diarrhea, about 8 BMs per day, a lot of gas.   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 He does report he took both medications together but was did have to wait to get the xifaxan  as he had to obtain this from [place]. Stools became more formed. He notes now stools remain solid vs. Previously being looser though still having 5-8 stools per day. States he takes lomotil  usually once per day which slows things down. He sometimes takes 2 if he Is going out. Has started otc IBGard but has not seen much improvement. No abdominal pain, rectal bleeding or melena. He notes some fecal urgency, especially after eating. He reports that he still feels bloated and has more gas, did have some improvement after he did the course of antibiotics. Appetite is good. Weight stable since earlier this year.    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 Cholelithiasis. No radiographic evidence of cholecystitis. Last Colonoscopy:12/19/22 - One 4 mm polyp in the ascending colon, removed                            with a cold snare. Resected and retrieved.                           - Diverticulosis in the sigmoid colon and in the                            descending colon. Biopsied.                           - One 2 mm polyp in the rectum, removed with a cold                            snare. Resected and retrieved.                           - Non-bleeding internal hemorrhoids.  A. COLON, ASCENDING, POLYPECTOMY:       Tubular adenoma.  Negative for high-grade dysplasia.   B. COLON, RANDOM, BIOPSY:       Colonic mucosa with no significant diagnostic alteration.       Negative for lymphocytic colitis or collagenous colitis.       Negative for activity, chronicity, granuloma, dysplasia or  malignancy.   C. RECTUM, POLYPECTOMY:       Hyperplastic polyp.       Negative for dysplasia  Last Endoscopy: never   Recommendations:  Repeat Colonoscopy 7 years   Filed Weights   12/30/23 0831  Weight: 187 lb 12.8 oz (85.2 kg)     Past Medical History:  Diagnosis Date   Allergy    Aortic aneurysm (HCC) 10/11/2020   On ultrasound 3.6 cm May 2020 2 repeat again in approximately 20 months.  Patient advised to quit smoking.   ED (erectile dysfunction)     Past Surgical History:  Procedure Laterality Date   BIOPSY  12/19/2022   Procedure: BIOPSY;  Surgeon: Eartha Angelia Sieving, MD;  [facility];  Service: Gastroenterology;;   COLONOSCOPY N/A 12/26/2017   Procedure: COLONOSCOPY;  Surgeon: Golda Claudis PENNER, MD;  [facility];  Service: Endoscopy;  Laterality: N/A;  930   COLONOSCOPY WITH PROPOFOL  N/A 12/19/2022   Procedure: COLONOSCOPY WITH PROPOFOL ;  Surgeon: Eartha Angelia Sieving,  MD;  [facility];  Service: Gastroenterology;  Laterality: N/A;  9:00AM;ASA 1   POLYPECTOMY  12/19/2022   Procedure: POLYPECTOMY;  Surgeon: Eartha Angelia, Sieving, MD;  [facility];  Service: Gastroenterology;;   VASECTOMY      Current Outpatient Medications  Medication Sig Dispense Refill   amLODipine  (NORVASC ) 5 MG tablet Take 1 tablet (5 mg total) by mouth daily. 30 tablet 3   diphenoxylate -atropine  (LOMOTIL ) 2.5-0.025 MG tablet TAKE 1 TO 2 TABLETS BY MOUTH FOUR TIMES DAILY AS NEEDED FOR DIARRHEA OR LOOSE STOOLS 90 tablet 1   Misc Natural Products (PROSTATE HEALTH PO) Take 2 tablets by mouth daily.     Multiple Vitamins-Minerals (CENTRUM SILVER PO) Take 1 tablet by mouth daily.     OVER THE COUNTER MEDICATION Take 1 tablet by mouth daily. neugenix     No current facility-administered medications for this visit.    Allergies as of 12/30/2023 - Review Complete 12/30/2023  Allergen Reaction Noted   Ace inhibitors  02/18/2018    Social History   Socioeconomic History   Marital status: Married    Spouse name: Not on file   Number of children: Not on file   Years of education: Not on file   Highest education level: Not on file  Occupational History   Not on file  Tobacco Use   Smoking status: Every Day    Current packs/day: 1.50    Types: Pipe, Cigarettes   Smokeless tobacco: Never  Vaping Use   Vaping status: Never Used  Substance and Sexual Activity   Alcohol use: Yes    Alcohol/week: 1.0 standard drink of alcohol    Types: 1 Glasses of wine per week   Drug use: Never   Sexual activity: Not on file  Other Topics Concern   Not on file  Social History Narrative   Not on file   Social Drivers of Health   Financial Resource Strain: Not on file  Food Insecurity: Not on file  Transportation Needs: Not on file  Physical Activity: Not on file  Stress: Not on file  Social Connections: Not on file    Review of  systems General: negative for  malaise, night sweats, fever, chills, weight loss Neck: Negative for lumps, goiter, pain and significant neck swelling Resp: Negative for cough, wheezing, dyspnea at rest CV: Negative for chest pain, leg swelling, palpitations, orthopnea GI: denies melena, hematochezia, nausea, vomiting, diarrhea, constipation, dysphagia, odyonophagia, early satiety or unintentional weight loss. +frequent stooling  MSK: Negative for joint pain or swelling, back pain, and muscle pain. Derm: Negative for itching or rash Psych: Denies depression, anxiety, memory loss, confusion. No homicidal or suicidal ideation.  Heme: Negative for prolonged bleeding, bruising easily, and swollen nodes. Endocrine: Negative for cold or heat intolerance, polyuria, polydipsia and goiter. Neuro: negative for tremor, gait imbalance, syncope and seizures. The remainder of the review of systems is noncontributory.  Physical Exam: BP 97/60   Pulse 65   Temp 98.3 F (36.8 C)   Ht 5' 9 (1.753 m)   Wt 187 lb 12.8 oz (85.2 kg)   BMI 27.73 kg/m  General:   Alert and oriented. No distress noted. Pleasant and cooperative.  Head:  Normocephalic and atraumatic. Eyes:  Conjuctiva clear without scleral icterus. Mouth:  Oral mucosa pink and moist. Good dentition. No lesions. Heart: Normal rate and rhythm, s1 and s2 heart sounds present.  Lungs: Clear lung sounds in all lobes. Respirations equal and unlabored. Abdomen:  +BS, soft, non-tender and non-distended. No rebound or guarding. No HSM or masses noted. Derm: No palmar erythema or jaundice Msk:  Symmetrical without gross deformities. Normal posture. Extremities:  Without edema. Neurologic:  Alert and  oriented x4 Psych:  Alert and cooperative. Normal mood and affect.  Invalid input(s): 6 MONTHS   ASSESSMENT: SAKAI WOLFORD is a 73 y.o. male presenting today for follow up of diarrhea, frequent stools  and SIMO  Patient with ongoing frequent BMs, gas, bloating with extensive  workup as above. previously with diarrhea that has improved, now with more formed stools but reports ongoing increased stool frequency, bloating and gas s/p treatment with xifaxan  and neomycin  after SIMO testing was positive in march. He is taking lomotil  PRN with some improvement but reports frequent stools interfere with his everyday life. We discussed that due to presence of SIMO on recent testing, this can sometimes require repeat course of therapy. I recommend we do another course of xifaxan  and neomycin , he can also start a daily probiotic, aim for one with atleast 2-3 strains of bacteria and continue with lomotil  PRN for now.    PLAN:  -start daily probiotic, aim for 2-3 strains of bacteria  -repeat xifaxan  550mg  TID, neomycin  500mg  BID x14 days (will obtain xifaxan  from [place] pharmacy, pt has this info already) -continue lomotil  PRN  All questions were answered, patient verbalized understanding and is in agreement with plan as outlined above.   Follow Up: 3 months   Genell Thede L. Mariette, MSN, APRN, AGNP-C Adult-Gerontology Nurse Practitioner [facility]  I have reviewed the note and agree with the APP's assessment as described in this progress note  Toribio Fortune, MD Gastroenterology and Hepatology [facility]astroenter[facility]

## 2023-12-30 NOTE — Patient Instructions (Signed)
 We will do another course of xifaxan  and neomycin , please obtain the xifaxan  from [place] as before and start these medications together once you receive that.  I would also recommend starting a daily probiotic, as discussed, no specific brand, aim for atleast 2-3 strains of bacteria You can continue lomotil  as needed to help slow stooling  Follow up 3 months  It was a pleasure to see you today. I want to create trusting relationships with patients and provide genuine, compassionate, and quality care. I truly value your feedback! please be on the lookout for a survey regarding your visit with me today. I appreciate your input about our visit and your time in completing this!    Alycia Cooperwood L. Nusaybah Ivie, MSN, APRN, AGNP-C Adult-Gerontology Nurse Practitioner [facility] Gastroenterology at [facility]

## 2024-02-17 ENCOUNTER — Ambulatory Visit (INDEPENDENT_AMBULATORY_CARE_PROVIDER_SITE_OTHER): Admitting: Family Medicine

## 2024-02-17 VITALS — BP 134/54 | HR 34 | Ht 69.0 in | Wt 189.0 lb

## 2024-02-17 DIAGNOSIS — H16139 Photokeratitis, unspecified eye: Secondary | ICD-10-CM

## 2024-02-17 DIAGNOSIS — Z23 Encounter for immunization: Secondary | ICD-10-CM | POA: Diagnosis not present

## 2024-02-17 DIAGNOSIS — I10 Essential (primary) hypertension: Secondary | ICD-10-CM | POA: Diagnosis not present

## 2024-02-17 DIAGNOSIS — E785 Hyperlipidemia, unspecified: Secondary | ICD-10-CM | POA: Diagnosis not present

## 2024-02-17 MED ORDER — AMLODIPINE BESYLATE 5 MG PO TABS: 5.0000 mg | ORAL_TABLET | Freq: Every day | ORAL | 2 refills | Status: DC

## 2024-02-17 NOTE — Progress Notes (Signed)
   Subjective:    Patient ID: Brandon Vega, male    DOB: 1950-07-28, 73 y.o.   MRN: [id number]  HPI  Started taking treatment for SIBO  The 10-year ASCVD risk score (Arnett DK, et al., 2019) is: 32.8%   Values used to calculate the score:     Age: 73 years     Clincally relevant sex: Male     Is Non-Hispanic African American: No     Diabetic: No     Tobacco smoker: Yes     Systolic Blood Pressure: 134 mmHg     Is BP treated: Yes     HDL Cholesterol: 45 mg/dL     Total Cholesterol: 182 mg/dL Very nice gentleman Tries to stay active and does a combination of walking and running Does his best to eat healthy Has frequent bowel movements on a regular basis but gastroenterology is trying to work with them regarding this He denies any major setbacks No chest tightness pressure pain or shortness of breath He does have mild hyperlipidemia and his risk score is moderately elevated.  Patient not interested currently with cholesterol medicine but upon discussion is open to doing a coronary calcium scan Review of Systems     Objective:   Physical Exam  General-in no acute distress Eyes-no discharge Lungs-respiratory rate normal, CTA CV-no murmurs, heart rate is in the 40s currently patient denies any shortness of breath dizziness or passing out.  He states his heart rate is always runs slow-he attributes this to his history of being an avid runner Extremities skin warm dry no edema Neuro grossly normal Behavior normal, alert       Assessment & Plan:  HTN-decent control with amlodipine  take every day Follow-up springtime Relative bradycardia no symptoms with it monitor for now Significant hyperlipidemia with age has increased risk of heart disease patient open to doing coronary calcium Flu shot today Pneumonia shot today Follow-up 6 months

## 2024-02-26 ENCOUNTER — Encounter (INDEPENDENT_AMBULATORY_CARE_PROVIDER_SITE_OTHER): Payer: Self-pay | Admitting: Gastroenterology

## 2024-03-10 ENCOUNTER — Ambulatory Visit (HOSPITAL_COMMUNITY)
Admission: RE | Admit: 2024-03-10 | Discharge: 2024-03-10 | Disposition: A | Discharge status: Home / Self Care | Payer: Self-pay | Source: Ambulatory Visit | Attending: Family Medicine | Admitting: Family Medicine

## 2024-03-10 DIAGNOSIS — I10 Essential (primary) hypertension: Secondary | ICD-10-CM | POA: Insufficient documentation

## 2024-03-12 ENCOUNTER — Other Ambulatory Visit: Payer: Self-pay | Admitting: Family Medicine

## 2024-03-12 ENCOUNTER — Telehealth: Payer: Self-pay | Admitting: Family Medicine

## 2024-03-12 ENCOUNTER — Ambulatory Visit: Payer: Self-pay | Admitting: Family Medicine

## 2024-03-12 DIAGNOSIS — Z79899 Other long term (current) drug therapy: Secondary | ICD-10-CM

## 2024-03-12 DIAGNOSIS — I2510 Atherosclerotic heart disease of native coronary artery without angina pectoris: Secondary | ICD-10-CM

## 2024-03-12 DIAGNOSIS — E785 Hyperlipidemia, unspecified: Secondary | ICD-10-CM

## 2024-03-12 MED ORDER — ROSUVASTATIN CALCIUM 20 MG PO TABS: 20.0000 mg | ORAL_TABLET | Freq: Every day | ORAL | 3 refills | Status: AC

## 2024-03-12 NOTE — Telephone Encounter (Signed)
 Nurses I spoke with patient Patient aware of his coronary calcification Starting rosuvastatin  Please give referral to cardiology for elevated coronary calcium

## 2024-03-13 ENCOUNTER — Other Ambulatory Visit: Payer: Self-pay

## 2024-03-13 DIAGNOSIS — I2510 Atherosclerotic heart disease of native coronary artery without angina pectoris: Secondary | ICD-10-CM

## 2024-03-16 ENCOUNTER — Ambulatory Visit (INDEPENDENT_AMBULATORY_CARE_PROVIDER_SITE_OTHER): Admitting: Gastroenterology

## 2024-03-16 ENCOUNTER — Encounter (INDEPENDENT_AMBULATORY_CARE_PROVIDER_SITE_OTHER): Payer: Self-pay | Admitting: Gastroenterology

## 2024-03-16 VITALS — BP 123/59 | HR 34 | Temp 97.8°F | Ht 69.0 in | Wt 191.0 lb

## 2024-03-16 DIAGNOSIS — R140 Abdominal distension (gaseous): Secondary | ICD-10-CM | POA: Insufficient documentation

## 2024-03-16 DIAGNOSIS — K529 Noninfective gastroenteritis and colitis, unspecified: Secondary | ICD-10-CM | POA: Diagnosis not present

## 2024-03-16 NOTE — Patient Instructions (Signed)
 Continue daily probiotic Increase water  intake, aim for 60 oz per day at minimum Stop lomotil  x1 week and update me on how symptoms are doing Please follow up on irregular heart beat as discussed at your visit  Follow up 4 months  It was a pleasure to see you today. I want to create trusting relationships with patients and provide genuine, compassionate, and quality care. I truly value your feedback! please be on the lookout for a survey regarding your visit with me today. I appreciate your input about our visit and your time in completing this!    Ashad Fawbush L. Rafia Shedden, MSN, APRN, AGNP-C Adult-Gerontology Nurse Practitioner [facility] Gastroenterology at [facility]

## 2024-03-16 NOTE — Telephone Encounter (Signed)
 There is an active referral in the chart.

## 2024-03-16 NOTE — Progress Notes (Addendum)
 Referring Provider: Alphonsa Glendia LABOR, MD Primary Care Physician:  Alphonsa Glendia LABOR, MD Primary GI Physician:   Chief Complaint  Patient presents with   Follow-up    Patient here today for a follow up on intestinal Methanogen Overgrowth, IBS with diarrhea. Patient still having issues frequent bm's, stools are mostly solid.    HPI:   Brandon Vega is a 73 y.o. male with past medical history of  CLL, ED, SIMO  Patient presenting today for:  Follow up of SIMO Frequent stooling and bloating   Last seen August, at that time did neomycin  and xifaxan , had improvement x2 weeks then recurrence of symptoms. Having formed stools but 5-8 per day, taking lomotil  daily, start IBgard without improvement. Having fecal urgency and gas/bloating   Recommend start daily probiotic, repeat xifaxan  and neomycin , continue lomotil  PRN  Present: States he repeated the xifaxan  and neomycin  but did not feel that It made any difference. He is having about 5 stools per day with some mild urgency after eating. He notes #1 and #5 on bristol stool scale. Sometimes he can have more blowouts and notes that he often feels a lot of pressure. He is taking daily probiotic and lomotil  both once daily. He is not taking anything else for gas/bloating. He notes that gas/bloating tends to be worse after eating. Water  intake is around a few bottles per day. He notes that often times stools are smaller in volume. He feels that after he is able to have a good BM, bloating improves. Appetite is good, no weight loss. Denies rectal bleeding or melena.    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 Cholelithiasis. No  radiographic evidence of cholecystitis. Last Colonoscopy:12/19/22 - One 4 mm polyp in the ascending colon, removed                            with a cold snare. Resected and retrieved.                           - Diverticulosis in the sigmoid colon and in the                            descending colon. Biopsied.                           - One 2 mm polyp in the rectum, removed with a cold                            snare. Resected and retrieved.                           - Non-bleeding internal hemorrhoids.  A. COLON, ASCENDING, POLYPECTOMY:       Tubular adenoma.       Negative for high-grade dysplasia.   B. COLON, RANDOM, BIOPSY:       Colonic mucosa with no significant diagnostic alteration.  Negative for lymphocytic colitis or collagenous colitis.       Negative for activity, chronicity, granuloma, dysplasia or  malignancy.   C. RECTUM, POLYPECTOMY:       Hyperplastic polyp.       Negative for dysplasia  Last Endoscopy: never   Recommendations:  Repeat Colonoscopy 7 years     Past Medical History:  Diagnosis Date   Allergy    Aortic aneurysm 10/11/2020   On ultrasound 3.6 cm May 2020 2 repeat again in approximately 20 months.  Patient advised to quit smoking.   ED (erectile dysfunction)     Past Surgical History:  Procedure Laterality Date   BIOPSY  12/19/2022   Procedure: BIOPSY;  Surgeon: Eartha Angelia Sieving, MD;  [facility];  Service: Gastroenterology;;   COLONOSCOPY N/A 12/26/2017   Procedure: COLONOSCOPY;  Surgeon: Golda Claudis PENNER, MD;  [facility];  Service: Endoscopy;  Laterality: N/A;  930   COLONOSCOPY WITH PROPOFOL  N/A 12/19/2022   Procedure: COLONOSCOPY WITH PROPOFOL ;  Surgeon: Eartha Angelia Sieving, MD;  [facility];  Service: Gastroenterology;  Laterality: N/A;  9:00AM;ASA 1   POLYPECTOMY  12/19/2022   Procedure: POLYPECTOMY;  Surgeon: Eartha Angelia, Sieving, MD;  [facility];  Service:  Gastroenterology;;   VASECTOMY      Current Outpatient Medications  Medication Sig Dispense Refill   amLODipine  (NORVASC ) 5 MG tablet Take 1 tablet (5 mg total) by mouth daily. 90 tablet 2   diphenoxylate -atropine  (LOMOTIL ) 2.5-0.025 MG tablet TAKE 1 TO 2 TABLETS BY MOUTH FOUR TIMES DAILY AS NEEDED FOR DIARRHEA OR LOOSE STOOLS (Patient taking differently: 1-2 daily) 90 tablet 1   Misc Natural Products (PROSTATE HEALTH PO) Take 2 tablets by mouth daily.     Multiple Vitamins-Minerals (CENTRUM SILVER PO) Take 1 tablet by mouth daily.     OVER THE COUNTER MEDICATION Take 1 tablet by mouth daily. neugenix     rosuvastatin (CRESTOR) 20 MG tablet Take 1 tablet (20 mg total) by mouth daily. (Patient not taking: Reported on 03/16/2024) 90 tablet 3   No current facility-administered medications for this visit.    Allergies as of 03/16/2024 - Review Complete 03/16/2024  Allergen Reaction Noted   Ace inhibitors  02/18/2018    Social History   Socioeconomic History   Marital status: Married    Spouse name: Not on file   Number of children: Not on file   Years of education: Not on file   Highest education level: Associate degree: academic program  Occupational History   Not on file  Tobacco Use   Smoking status: Every Day    Current packs/day: 1.50    Types: Pipe, Cigarettes   Smokeless tobacco: Never  Vaping Use   Vaping status: Never Used  Substance and Sexual Activity   Alcohol use: Yes    Alcohol/week: 1.0 standard drink of alcohol    Types: 1 Glasses of wine per week   Drug use: Never   Sexual activity: Not on file  Other Topics Concern   Not on file  Social History Narrative   Not on file   Social Drivers of Health   Financial Resource Strain: Low Risk  (02/13/2024)   Overall Financial Resource Strain (CARDIA)    Difficulty of Paying Living Expenses: Not hard at all  Food Insecurity: No Food Insecurity (02/13/2024)   Hunger Vital Sign    Worried About Running Out of Food  in the Last Year: Never true  Ran Out of Food in the Last Year: Never true  Transportation Needs: No Transportation Needs (02/13/2024)   PRAPARE - Administrator, Civil Service (Medical): No    Lack of Transportation (Non-Medical): No  Physical Activity: Sufficiently Active (02/13/2024)   Exercise Vital Sign    Days of Exercise per Week: 3 days    Minutes of Exercise per Session: 60 min  Stress: No Stress Concern Present (02/13/2024)   [employer] of Occupational Health - Occupational Stress Questionnaire    Feeling of Stress: Not at all  Social Connections: Moderately Integrated (02/13/2024)   Social Connection and Isolation Panel    Frequency of Communication with Friends and Family: Once a week    Frequency of Social Gatherings with Friends and Family: Once a week    Attends Religious Services: More than 4 times per year    Active Member of [employer] or Organizations: Yes    Attends Engineer, Structural: More than 4 times per year    Marital Status: Married    Review of systems General: negative for malaise, night sweats, fever, chills, weight loss Neck: Negative for lumps, goiter, pain and significant neck swelling Resp: Negative for cough, wheezing, dyspnea at rest CV: Negative for chest pain, leg swelling, palpitations, orthopnea GI: denies melena, hematochezia, nausea, vomiting, diarrhea, constipation, dysphagia, odyonophagia, early satiety or unintentional weight loss. +bloating +frequent stooling  MSK: Negative for joint pain or swelling, back pain, and muscle pain. Derm: Negative for itching or rash Psych: Denies depression, anxiety, memory loss, confusion. No homicidal or suicidal ideation.  Heme: Negative for prolonged bleeding, bruising easily, and swollen nodes. Endocrine: Negative for cold or heat intolerance, polyuria, polydipsia and goiter. Neuro: negative for tremor, gait imbalance, syncope and seizures. The remainder of the review of systems  is noncontributory.  Physical Exam: BP (!) 123/59 (BP Location: Right Arm, Patient Position: Sitting, Cuff Size: Normal)   Pulse (!) 34   Temp 97.8 F (36.6 C) (Temporal)   Ht 5' 9 (1.753 m)   Wt 191 lb (86.6 kg)   BMI 28.21 kg/m  General:   Alert and oriented. No distress noted. Pleasant and cooperative.  Head:  Normocephalic and atraumatic. Eyes:  Conjuctiva clear without scleral icterus. Mouth:  Oral mucosa pink and moist. Good dentition. No lesions. Heart: bradycardia, irregular rhythm  Lungs: Clear lung sounds in all lobes. Respirations equal and unlabored. Abdomen:  +BS, soft, non-tender and non-distended. No rebound or guarding. No HSM or masses noted. Derm: No palmar erythema or jaundice Msk:  Symmetrical without gross deformities. Normal posture. Extremities:  Without edema. Neurologic:  Alert and  oriented x4 Psych:  Alert and cooperative. Normal mood and affect.  Invalid input(s): 6 MONTHS   ASSESSMENT: Brandon Vega is a 73 y.o. male presenting today for follow up of SIMO, frequent stooling and bloating   Previously with testing positive for SIMO, he did course of xifaxan  and neomycin  with only short interval improvement. He was recommended to do second round of xifaxan  and neomycin  at last visit as symptoms persisted and it was considered he may need a second round of treatment. He felt no improvement with repeat course of antibiotic. Continues to have bloating, frequent stooling though notes 1 and 5 on bristol stool scale, often does not feel he is emptying out. Bloating improves with a good BM. Taking lomotil  daily. At this point, query if he is having constipation with some overflow diarrhea. I recommended he stop lomotil  x1 week  to see what his baseline is, increase water  intake to around 60ml per day. Pending clinical course after stopping lomotil , may consider addition of miralax/metamucil to help with more sufficient BMs.   Irregular Heartbeat: irregular  heartbeat noted on exam today. I discussed with the patient who states another provider mentioned possible A fib to him. He has upcoming cardiologist appt. I recommended he discuss this with them or seek sooner evaluation if he has any chest pain/shortness of breath.   PLAN:  -stop lomotil  x1 week and update me on symptoms -increase water  intake, aim for 60 oz per day -continue daily probiotic -consider addition of miralax bid x2 weeks, metamucil BID pending clinical course after stopping lomotil  -proceed with Cardiology evaluation regarding irregular heartbeat/seek sooner evaluation for chest pain/sob  All questions were answered, patient verbalized understanding and is in agreement with plan as outlined above.   Follow Up: 4 months   Kailin Principato L. Mariette, MSN, APRN, AGNP-C Adult-Gerontology Nurse Practitioner [facility]  I have reviewed the note and agree with the APP's assessment as described in this progress note  Toribio Fortune, MD Gastroenterology and Hepatology [facility] Gastroenterology

## 2024-03-20 ENCOUNTER — Other Ambulatory Visit: Payer: Self-pay

## 2024-03-20 ENCOUNTER — Telehealth: Payer: Self-pay | Admitting: Family Medicine

## 2024-03-20 DIAGNOSIS — J8410 Pulmonary fibrosis, unspecified: Secondary | ICD-10-CM

## 2024-03-20 NOTE — Progress Notes (Signed)
 Referral has been placed.

## 2024-03-20 NOTE — Telephone Encounter (Signed)
 Autumn Patient needs a high-resolution CAT scan of the lungs/chest due to evidence of pulmonary fibrosis showing up on a coronary calcium scan  The test was inadvertently ordered by the CMA as interventional radiology I refused that particular order It does need to be set up with radiology thank you

## 2024-03-24 NOTE — Telephone Encounter (Signed)
CT ordered in EPIC 

## 2024-04-06 ENCOUNTER — Ambulatory Visit (INDEPENDENT_AMBULATORY_CARE_PROVIDER_SITE_OTHER): Admitting: Gastroenterology

## 2024-05-15 ENCOUNTER — Inpatient Hospital Stay: Attending: Hematology

## 2024-05-15 DIAGNOSIS — R61 Generalized hyperhidrosis: Secondary | ICD-10-CM | POA: Insufficient documentation

## 2024-05-15 DIAGNOSIS — R5383 Other fatigue: Secondary | ICD-10-CM | POA: Insufficient documentation

## 2024-05-15 DIAGNOSIS — C9110 Chronic lymphocytic leukemia of B-cell type not having achieved remission: Secondary | ICD-10-CM | POA: Insufficient documentation

## 2024-05-15 LAB — COMPREHENSIVE METABOLIC PANEL WITH GFR
ALT: 21 U/L (ref 0–44)
AST: 29 U/L (ref 15–41)
Albumin: 4.2 g/dL (ref 3.5–5.0)
Alkaline Phosphatase: 58 U/L (ref 38–126)
Anion gap: 12 (ref 5–15)
BUN: 15 mg/dL (ref 8–23)
CO2: 24 mmol/L (ref 22–32)
Calcium: 9.4 mg/dL (ref 8.9–10.3)
Chloride: 104 mmol/L (ref 98–111)
Creatinine, Ser: 0.95 mg/dL (ref 0.61–1.24)
GFR, Estimated: >60 mL/min
Glucose, Bld: 88 mg/dL (ref 70–99)
Potassium: 4.2 mmol/L (ref 3.5–5.1)
Sodium: 140 mmol/L (ref 135–145)
Total Bilirubin: 1.7 mg/dL — ABNORMAL HIGH (ref 0.0–1.2)
Total Protein: 7.1 g/dL (ref 6.5–8.1)

## 2024-05-15 LAB — CBC WITH DIFFERENTIAL/PLATELET
Abs Immature Granulocytes: 0.05 K/uL (ref 0.00–0.07)
Basophils Absolute: 0.1 K/uL (ref 0.0–0.1)
Basophils Relative: 1 %
Eosinophils Absolute: 0.3 K/uL (ref 0.0–0.5)
Eosinophils Relative: 2 %
HCT: 45.1 % (ref 39.0–52.0)
Hemoglobin: 14.9 g/dL (ref 13.0–17.0)
Immature Granulocytes: 0 %
Lymphocytes Relative: 32 %
Lymphs Abs: 4.2 K/uL — ABNORMAL HIGH (ref 0.7–4.0)
MCH: 31.5 pg (ref 26.0–34.0)
MCHC: 33 g/dL (ref 30.0–36.0)
MCV: 95.3 fL (ref 80.0–100.0)
Monocytes Absolute: 0.9 K/uL (ref 0.1–1.0)
Monocytes Relative: 7 %
Neutro Abs: 7.5 K/uL (ref 1.7–7.7)
Neutrophils Relative %: 58 %
Platelets: 218 K/uL (ref 150–400)
RBC: 4.73 MIL/uL (ref 4.22–5.81)
RDW: 12.4 % (ref 11.5–15.5)
WBC: 13 K/uL — ABNORMAL HIGH (ref 4.0–10.5)
nRBC: 0 % (ref 0.0–0.2)

## 2024-05-15 LAB — LACTATE DEHYDROGENASE: LDH: 285 U/L — ABNORMAL HIGH (ref 105–235)

## 2024-05-16 LAB — MISC LABCORP TEST (SEND OUT): LabCorp test name: 452423

## 2024-05-19 ENCOUNTER — Ambulatory Visit (HOSPITAL_COMMUNITY)
Admission: RE | Admit: 2024-05-19 | Discharge: 2024-05-19 | Disposition: A | Discharge status: Home / Self Care | Source: Ambulatory Visit | Attending: Family Medicine | Admitting: Family Medicine

## 2024-05-19 DIAGNOSIS — J8410 Pulmonary fibrosis, unspecified: Secondary | ICD-10-CM | POA: Insufficient documentation

## 2024-05-19 LAB — FISH HES LEUKEMIA, 4Q12 REA

## 2024-05-22 ENCOUNTER — Inpatient Hospital Stay (HOSPITAL_BASED_OUTPATIENT_CLINIC_OR_DEPARTMENT_OTHER): Admitting: Oncology

## 2024-05-22 VITALS — BP 116/64 | HR 63 | Temp 97.9°F | Resp 16 | Wt 193.8 lb

## 2024-05-22 DIAGNOSIS — C9110 Chronic lymphocytic leukemia of B-cell type not having achieved remission: Secondary | ICD-10-CM

## 2024-05-22 NOTE — Progress Notes (Signed)
 " Patient Care Team: Alphonsa Glendia LABOR, MD as PCP - General (Family Medicine)  Clinic Day:  05/22/2024  Referring physician: Alphonsa Glendia LABOR, MD   CHIEF COMPLAINT:  CC: Stage 0 CLL   Brandon Vega 74 y.o. male was transferred to my care after his prior physician has left.   ASSESSMENT & PLAN:   Assessment & Plan: Brandon Vega  is a 74 y.o. 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 Mild fatigue likely multifactorial. No anemia or thrombocytopenia. No indication for CLL-directed therapy.  - Provided reassurance regarding stable CLL status and laboratory findings. - Advised to monitor for fever, chills, night sweats, anorexia, weight loss, or new lymphadenopathy and report if he occurs. - Planned surveillance with CBC and disease assessment every six months. - Scheduled follow-up in six months.  Return to clinic in 6 months for follow up  The patient understands the plans discussed today and is in agreement with them.  He knows to contact our office if he develops concerns prior to his next appointment.  30 minutes of total time was spent for this patient encounter, including preparation,review of records,  face-to-face counseling with the patient and coordination of care, physical exam, and documentation of the encounter.    LILLETTE Verneta SAUNDERS Teague,acting as a neurosurgeon for Mickiel Dry, MD.,have documented all relevant documentation on the behalf of Mickiel Dry, MD,as directed by  Mickiel Dry, MD while in the presence of Mickiel Dry, MD.  I, Mickiel Dry MD, have reviewed the above documentation for accuracy and completeness, and I agree with the above.    Mickiel Dry, MD  [facility] - A DEPT OF [facility] [address] [place] [address] Dept:  [phone] Dept Fax: [phone]   No orders of the defined types were placed in this encounter.    ONCOLOGY HISTORY:   I have reviewed his chart and materials related to his cancer extensively and collaborated history with the patient. Summary of oncologic history is as follows:   Diagnosis: Stage 0 CLL   -07/2017: Incidental lymphocytic leukocytosis -01/07/2019: CBC diff: WBC - 12.1 with absolute lymphocytes at 5.8.  -01/07/2019: Normal Beta-2  microglobulin. Normal LDH. Normal reticulocytes.  -01/08/2019: Peripheral blood flow cytometry:  CD5 positive monoclonal B cell population consistent with CLL. However, in the absence of lymphadenopathy, organomegaly, cytopenias, or disease related symptoms, the presence of less than 5000 monoclonal B-lymphocytes per cubic millimeter is defined as monoclonal B-lymphocytosis (MBL).  -05/15/2024: [name]: Normal.  -05/15/2024: IgVH Somatic Hypermutation: Results pending -Patient experiences occasional night sweats, but is otherwise asymptomatic and does not require treatment  Current Treatment:  Surveillance  INTERVAL HISTORY:   Discussed the use of AI scribe software for clinical note transcription with the patient, who gave verbal consent to proceed.  History of Present Illness Brandon Vega is a 74 year old male with chronic lymphocytic leukemia (CLL), Rai stage 0, presenting for routine hematology/oncology follow-up to monitor disease status.  He reports no fever, chills, night sweats, weight loss, lymphadenopathy, easy bruising, or bleeding. He notes mild weight fluctuation, with a recent three-pound gain, and no loss of appetite. His energy is decreased compared to ten years ago, which he attributes to age and comorbidities.  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 He is  followed by gastroenterology and has not experienced acute symptoms related to this abnormality. He expressed concern  regarding the significance of his bilirubin.  He has developed a right hand tremor over the past year, present most of the time and worsened with activity, with milder involvement of the left hand. The tremor has made some tasks more difficult.   I have reviewed the past medical history, past surgical history, social history and family history with the patient and they are unchanged from previous note.  ALLERGIES:  has no active allergies.  MEDICATIONS:  Current Outpatient Medications  Medication Sig Dispense Refill   amLODipine  (NORVASC ) 5 MG tablet Take 1 tablet (5 mg total) by mouth daily. 90 tablet 2   diphenoxylate -atropine  (LOMOTIL ) 2.5-0.025 MG tablet TAKE 1 TO 2 TABLETS BY MOUTH FOUR TIMES DAILY AS NEEDED FOR DIARRHEA OR LOOSE STOOLS (Patient taking differently: 1-2 daily) 90 tablet 1   Misc Natural Products (PROSTATE HEALTH PO) Take 2 tablets by mouth daily.     Multiple Vitamins-Minerals (CENTRUM SILVER PO) Take 1 tablet by mouth daily.     OVER THE COUNTER MEDICATION Take 1 tablet by mouth daily. neugenix     rosuvastatin  (CRESTOR ) 20 MG tablet Take 1 tablet (20 mg total) by mouth daily. 90 tablet 3   No current facility-administered medications for this visit.    VITALS:  Blood pressure 116/64, pulse 63, temperature 97.9 F (36.6 C), temperature source Oral, resp. rate 16, weight 193 lb 12.6 oz (87.9 kg), SpO2 98%.  Wt Readings from Last 3 Encounters:  05/22/24 193 lb 12.6 oz (87.9 kg)  03/16/24 191 lb (86.6 kg)  02/17/24 189 lb (85.7 kg)    Body mass index is 28.62 kg/m.  Performance status (ECOG): 0 - Asymptomatic  PHYSICAL EXAM:   GENERAL:alert, no distress and comfortable SKIN: skin color, texture, turgor are normal, no rashes or significant lesions LYMPH:  no palpable lymphadenopathy in the cervical, axillary or inguinal LUNGS: clear to auscultation and percussion with normal breathing effort HEART: regular rate & rhythm and no murmurs and no lower extremity  edema ABDOMEN:abdomen soft, non-tender and normal bowel sounds Musculoskeletal:no cyanosis of digits and no clubbing  NEURO: alert & oriented x 3 with fluent speech  LABORATORY DATA:  I have reviewed the data as listed  Lab Results  Component Value Date   WBC 13.0 (H) 05/15/2024   NEUTROABS 7.5 05/15/2024   HGB 14.9 05/15/2024   HCT 45.1 05/15/2024   MCV 95.3 05/15/2024   PLT 218 05/15/2024      Chemistry      Component Value Date/Time   NA 140 05/15/2024 0919   NA 142 10/04/2023 0901   K 4.2 05/15/2024 0919   CL 104 05/15/2024 0919   CO2 24 05/15/2024 0919   BUN 15 05/15/2024 0919   BUN 14 10/04/2023 0901   CREATININE 0.95 05/15/2024 0919      Component Value Date/Time   CALCIUM  9.4 05/15/2024 0919   ALKPHOS 58 05/15/2024 0919   AST 29 05/15/2024 0919   ALT 21 05/15/2024 0919   BILITOT 1.7 (H) 05/15/2024 0919   BILITOT 1.7 (H) 10/04/2023 0901       RADIOGRAPHIC STUDIES: I have personally reviewed the radiological images as listed and agreed with the findings in the report.  "

## 2024-05-23 ENCOUNTER — Ambulatory Visit: Payer: Self-pay | Admitting: Family Medicine

## 2024-05-23 ENCOUNTER — Encounter: Payer: Self-pay | Admitting: Family Medicine

## 2024-05-23 DIAGNOSIS — I712 Thoracic aortic aneurysm, without rupture, unspecified: Secondary | ICD-10-CM | POA: Insufficient documentation

## 2024-05-26 ENCOUNTER — Other Ambulatory Visit: Payer: Self-pay | Admitting: Family Medicine

## 2024-05-26 ENCOUNTER — Telehealth: Payer: Self-pay | Admitting: Family Medicine

## 2024-05-26 MED ORDER — ASPIRIN 81 MG PO TBEC: 81.0000 mg | DELAYED_RELEASE_TABLET | Freq: Every day | ORAL | Status: AC

## 2024-05-26 NOTE — Telephone Encounter (Signed)
 Front staff-please connect with patient-please set up appointment as per below I would like for the patient to do a standard follow-up with myself in late July or August more as a way of checking based on his chronic health issues high blood pressure hyperlipidemia etc.  Thanks-Dr. Glendia

## 2024-05-27 ENCOUNTER — Other Ambulatory Visit: Payer: Self-pay

## 2024-05-27 ENCOUNTER — Telehealth: Payer: Self-pay | Admitting: Family Medicine

## 2024-05-27 DIAGNOSIS — J8410 Pulmonary fibrosis, unspecified: Secondary | ICD-10-CM

## 2024-05-27 LAB — IGVH SOMATIC HYPERMUTATION

## 2024-05-27 NOTE — Telephone Encounter (Signed)
 Referral has been placed.

## 2024-05-27 NOTE — Telephone Encounter (Signed)
 Nurses Please go ahead and put in the consult to see Dr. Jude pulmonologist with [facility] in [place]  Diagnosis pulmonary fibrosis

## 2024-06-05 ENCOUNTER — Ambulatory Visit: Admitting: Pulmonary Disease

## 2024-06-05 ENCOUNTER — Ambulatory Visit: Admitting: Internal Medicine

## 2024-06-05 ENCOUNTER — Encounter: Payer: Self-pay | Admitting: Pulmonary Disease

## 2024-06-05 VITALS — BP 158/70 | HR 46 | Temp 98.4°F | Ht 69.0 in | Wt 193.0 lb

## 2024-06-05 DIAGNOSIS — J849 Interstitial pulmonary disease, unspecified: Secondary | ICD-10-CM

## 2024-06-05 DIAGNOSIS — J8410 Pulmonary fibrosis, unspecified: Secondary | ICD-10-CM

## 2024-06-05 NOTE — Patient Instructions (Signed)
" °  VISIT SUMMARY: Today, we discussed your lung scarring, which was identified on a recent CT scan. We also reviewed your history of smoking, environmental exposures, and other health conditions.  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 This condition could be related to environmental exposures or autoimmune diseases. -We have ordered blood tests to check for autoimmune conditions and connective tissue diseases. -We have ordered pulmonary function tests to assess your lung function. -We will discuss potential medication options to slow the progression of the fibrosis after we have your test results. -It is important to stop smoking to prevent worsening of the fibrosis.    Contains text generated by Abridge.   "

## 2024-06-05 NOTE — Progress Notes (Signed)
 "              Brandon Vega    [id number]    26-Jul-1950  Primary Care Physician:Luking, Glendia LABOR, MD  Referring Physician: Alphonsa Glendia LABOR, MD [address] [place],  [place] [address]  Chief complaint: Consult for interstitial lung disease  HPI: 74 y.o. who  has a past medical history of Allergy, Aortic aneurysm (10/11/2020), and ED (erectile dysfunction). CLL, SIBO, IBS Discussed the use of AI scribe software for clinical note transcription with the patient, who gave verbal consent to proceed.  History of Present Illness Brandon Vega is a 74 year old male who presents for evaluation of lung scarring. He was referred by Dr. Alphonsa for evaluation of lung scarring seen on a CT scan.  Pulmonary fibrosis - Lung fibrosis incidentally identified on cardiac CT in November - High-resolution chest CT in early January 2025 confirmed lung scarring - No shortness of breath - Exercises on an elliptical for 30 minutes, three to four days per week, with weight training, without limitation  Tobacco use - Smoked a pipe for approximately thirty years - Pipe use reduced over the past five years - No cigarette or cigar smoking  Environmental and occupational exposures - No exposure to mold or other environmental triggers at home - Uses a dehumidifier in the basement  Chronic lymphocytic leukemia (cll) - History of CLL  Musculoskeletal symptoms - Hip bursitis limits ability to run - Uses elliptical for exercise instead of running  Upper respiratory symptoms - Sinus drainage present   Relevant Pulmonary history: Pets: No pets 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  No feather pillows or comforters No h/o chemo/XRT/amiodarone/macrodantin/MTX  No exposure to asbestos, silica or other organic allergens  Smoking history: Smokes a pipe intermittently for 30  years.  No cigarettes or vaping Travel history: No significant travel history Family history: Parents had lung cancer.  They were smokers.   Outpatient Encounter Medications as of 06/05/2024  Medication Sig   amLODipine  (NORVASC ) 5 MG tablet Take 1 tablet (5 mg total) by mouth daily.   aspirin  EC 81 MG tablet Take 1 tablet (81 mg total) by mouth daily. Swallow whole.   diphenoxylate -atropine  (LOMOTIL ) 2.5-0.025 MG tablet TAKE 1 TO 2 TABLETS BY MOUTH FOUR TIMES DAILY AS NEEDED FOR DIARRHEA OR LOOSE STOOLS (Patient taking differently: 1-2 daily)   Misc Natural Products (PROSTATE HEALTH PO) Take 2 tablets by mouth daily.   Multiple Vitamins-Minerals (CENTRUM SILVER PO) Take 1 tablet by mouth daily.   OVER THE COUNTER MEDICATION Take 1 tablet by mouth daily. neugenix   rosuvastatin  (CRESTOR ) 20 MG tablet Take 1 tablet (20 mg total) by mouth daily.   No facility-administered encounter medications on file as of 06/05/2024.     Physical Exam: Today's Vitals   06/05/24 0941  BP: (!) 149/64  Pulse: (!) 46  Temp: 98.4 F (36.9 C)  TempSrc: Oral  SpO2: 97%  Weight: 193 lb (87.5 kg)  Height: 5' 9 (1.753 m)   Body mass index is 28.5 kg/m.  Physical Exam GEN: No acute distress. CV: Regular rate and rhythm, no murmurs. LUNGS: Clear to auscultation bilaterally, normal respiratory effort. SKIN JOINTS: Warm and dry, no rash.    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  PFTs:  Labs:  Assessment and Plan Assessment & Plan Pulmonary fibrosis Early-stage pulmonary fibrosis with scarring in both lungs, more prominent in 2026 compared to 2024. No current dyspnea. Differential diagnosis  includes exposure-related causes and autoimmune conditions such as rheumatoid arthritis. Smoking may exacerbate the condition. - Ordered blood tests to evaluate for autoimmune conditions and connective tissue diseases. - Ordered pulmonary function tests to assess lung function. - Will discuss potential medication options to slow progression after test results are available. - Advised smoking cessation to prevent worsening of fibrosis.  Recommendations: Order labs for CTD serologies PFTs Smoking cessation  Arch Methot MD [facility] 06/05/2024, 9:53 AM  CC: Alphonsa Glendia LABOR, MD   "

## 2024-06-09 ENCOUNTER — Ambulatory Visit: Payer: Self-pay | Admitting: Pulmonary Disease

## 2024-06-09 LAB — ANA,IFA RA DIAG PNL W/RFLX TIT/PATN
Anti Nuclear Antibody (ANA): POSITIVE — AB
Cyclic Citrullin Peptide Ab: 16 U
Rheumatoid fact SerPl-aCnc: <10 [IU]/mL

## 2024-06-09 LAB — ANTI-NUCLEAR AB-TITER (ANA TITER): ANA Titer 1: 1:40 {titer} — ABNORMAL HIGH

## 2024-06-10 ENCOUNTER — Ambulatory Visit: Payer: Self-pay | Attending: Internal Medicine | Admitting: Internal Medicine

## 2024-06-10 ENCOUNTER — Other Ambulatory Visit: Payer: Self-pay | Admitting: Internal Medicine

## 2024-06-10 ENCOUNTER — Encounter: Payer: Self-pay | Admitting: Internal Medicine

## 2024-06-10 ENCOUNTER — Ambulatory Visit

## 2024-06-10 VITALS — BP 130/64 | HR 36 | Ht 69.0 in | Wt 192.4 lb

## 2024-06-10 DIAGNOSIS — I10 Essential (primary) hypertension: Secondary | ICD-10-CM | POA: Diagnosis not present

## 2024-06-10 DIAGNOSIS — I272 Pulmonary hypertension, unspecified: Secondary | ICD-10-CM | POA: Diagnosis not present

## 2024-06-10 DIAGNOSIS — I719 Aortic aneurysm of unspecified site, without rupture: Secondary | ICD-10-CM

## 2024-06-10 DIAGNOSIS — Z136 Encounter for screening for cardiovascular disorders: Secondary | ICD-10-CM

## 2024-06-10 DIAGNOSIS — R931 Abnormal findings on diagnostic imaging of heart and coronary circulation: Secondary | ICD-10-CM | POA: Insufficient documentation

## 2024-06-10 DIAGNOSIS — I493 Ventricular premature depolarization: Secondary | ICD-10-CM

## 2024-06-10 DIAGNOSIS — E7849 Other hyperlipidemia: Secondary | ICD-10-CM | POA: Diagnosis not present

## 2024-06-10 DIAGNOSIS — I714 Abdominal aortic aneurysm, without rupture, unspecified: Secondary | ICD-10-CM | POA: Insufficient documentation

## 2024-06-10 DIAGNOSIS — Z01810 Encounter for preprocedural cardiovascular examination: Secondary | ICD-10-CM | POA: Diagnosis not present

## 2024-06-10 DIAGNOSIS — E785 Hyperlipidemia, unspecified: Secondary | ICD-10-CM | POA: Insufficient documentation

## 2024-06-10 MED ORDER — METOPROLOL TARTRATE 100 MG PO TABS: 100.0000 mg | ORAL_TABLET | Freq: Once | ORAL | 0 refills | Status: AC

## 2024-06-10 MED ORDER — AMLODIPINE BESYLATE 5 MG PO TABS: 7.5000 mg | ORAL_TABLET | Freq: Every day | ORAL | 2 refills | Status: AC

## 2024-06-10 NOTE — Patient Instructions (Addendum)
 Medication Instructions:  Your physician has recommended you make the following change in your medication:  Increase Amlodipine  from 5 mg to 7.5 mg once daily Continue taking all other medications as prescribed   Labwork: BMET to be completed prior to Cardiac CT (1-2 weeks)  Testing/Procedures: Non-Cardiac CT Angiography (CTA), is a special type of CT scan that uses a computer to produce multi-dimensional views of major blood vessels throughout the body. In CT angiography, a contrast material is injected through an IV to help visualize the blood vessels  Your physician has requested that you have an echocardiogram. Echocardiography is a painless test that uses sound waves to create images of your heart. It provides your doctor with information about the size and shape of your heart and how well your hearts chambers and valves are working. This procedure takes approximately one hour. There are no restrictions for this procedure. Please do NOT wear cologne, perfume, aftershave, or lotions (deodorant is allowed). Please arrive 15 minutes prior to your appointment time.  Please note: We ask at that you not bring children with you during ultrasound (echo/ vascular) testing. Due to room size and safety concerns, children are not allowed in the ultrasound rooms during exams. Our front office staff cannot provide observation of children in our lobby area while testing is being conducted. An adult accompanying a patient to their appointment will only be allowed in the ultrasound room at the discretion of the ultrasound technician under special circumstances. We apologize for any inconvenience.    Your cardiac CT will be scheduled at one of the below locations:   [facility] [address] [place], [place] [address] [phone] (Severe contrast allergies only)  OR   [facility] [address] [place], [place] [address] [phone]  OR   [facility] [address]  [place], [place] [address]   If scheduled at [facility], please arrive at the [facility] and Children's Entrance (Entrance C2) of [facility] 30 minutes prior to test start time. You can use the FREE valet parking offered at entrance C (encouraged to control the heart rate for the test)  Proceed to the [facility] Radiology Department (first floor) to check-in and test prep.  All radiology patients and guests should use entrance C2 at [facility], accessed from [facility], even though the hospital's physical address listed is [address]  If scheduled at the Heart and Vascular Tower at [address], please enter the parking lot using the [address] entrance and use the FREE valet service at the patient drop-off area. Enter the building and check-in with registration on the main floor.  There is spacious parking and easy access to the radiology department from the [facility] Heart and Vascular entrance. Please enter here and check-in with the desk attendant.   If scheduled at [facility], please arrive 30 minutes early for check-in and test prep.  Please follow these instructions carefully (unless otherwise directed):  An IV will be required for this test and Nitroglycerin will be given.  Hold all erectile dysfunction medications at least 3 days (72 hrs) prior to test. (Ie viagra , cialis, sildenafil , tadalafil, etc)   On the Night Before the Test: Be sure to Drink plenty of water . Do not consume any caffeinated/decaffeinated beverages or chocolate 12 hours prior to your test. Do not take any antihistamines 12 hours prior to your test.  If the patient  has contrast allergy: No allergy  On the Day of the Test: Drink plenty of water  until 1 hour prior to the test. Do not eat any food 1 hour prior to test. You may take your regular medications prior to the test.  Take Metoprolol  100 mg(Lopressor ) two  hours prior to test. Hold Amlodipine  the morning of the test If you take Furosemide/Hydrochlorothiazide/Spironolactone/Chlorthalidone, please HOLD on the morning of the test. Patients who wear a continuous glucose monitor MUST remove the device prior to scanning. FEMALES- please wear underwire-free bra if available, avoid dresses & tight clothing      After the Test: Drink plenty of water . After receiving IV contrast, you may experience a mild flushed feeling. This is normal. On occasion, you may experience a mild rash up to 24 hours after the test. This is not dangerous. If this occurs, you can take Benadryl 25 mg, Zyrtec, Claritin, or Allegra and increase your fluid intake. (Patients taking Tikosyn should avoid Benadryl, and may take Zyrtec, Claritin, or Allegra) If you experience trouble breathing, this can be serious. If it is severe call 911 IMMEDIATELY. If it is mild, please call our office.  We will call to schedule your test 2-4 weeks out understanding that some insurance companies will need an authorization prior to the service being performed.   For more information and frequently asked questions, please visit our website : [email or web address]  For non-scheduling related questions, please contact the cardiac imaging nurse navigator should you have any questions/concerns: Cardiac Imaging Nurse Navigators Direct Office Dial: [phone]   For scheduling needs, including cancellations and rescheduling, please call Brittany, [phone].  For billing questions, please call [phone].   Your physician has recommended that you wear a Zio monitor.   This monitor is a medical device that records the hearts electrical activity. Doctors most often use these monitors to diagnose arrhythmias. Arrhythmias are problems with the speed or rhythm of the heartbeat. The monitor is a small device applied to your chest. You can wear one while you do your normal daily activities.  While wearing this monitor if you have any symptoms to push the button and record what you felt. Once you have worn this monitor for the period of time provider prescribed (for 14 days), you will return the monitor device in the postage paid box. Once it is returned they will download the data collected and provide us  with a report which the provider will then review and we will call you with those results. Important tips:  Avoid showering during the first 24 hours of wearing the monitor. Avoid excessive sweating to help maximize wear time. Do not submerge the device, no hot tubs, and no swimming pools. Keep any lotions or oils away from the patch. After 24 hours you may shower with the patch on. Take brief showers with your back facing the shower head.  Do not remove patch once it has been placed because that will interrupt data and decrease adhesive wear time. Push the button when you have any symptoms and write down what you were feeling. Once you have completed wearing your monitor, remove and place into box which has postage paid and place in your outgoing mailbox.  If for some reason you have misplaced your box then call our office and we can provide another box and/or mail it off for you.   Follow-Up: Your physician recommends that you schedule a follow-up appointment in: 3 months  Any Other Special Instructions Will Be Listed Below (If Applicable).  Thank you for choosing [facility]!     If you need a refill on your cardiac medications before your next appointment, please call your pharmacy.

## 2024-06-10 NOTE — Progress Notes (Addendum)
 "    Cardiology Office Note  Date: 06/10/2024   ID: Brandon Vega, DOB January 04, 1951, MRN [id number]  PCP:  Alphonsa Glendia LABOR, MD  Cardiologist:  Diannah SHAUNNA Maywood, MD Electrophysiologist:  None   History of Present Illness: Brandon Vega is a 74 y.o. male known to have CLL, incidental finding of early pulmonary fibrosis, elevated coronary calcium  score was referred to cardiology clinic for evaluation of elevated CAC score.  Patient works out at least 3-4 times per week.  He used to run 4 miles in the past but due to hip issues, currently jogging.  Works on an elliptical for 30 minutes.  Lifts weights.  Patient had 4.2 cm ascending aortic aneurysm by CT chest on 05/19/2024.  It was 3.9 cm by CT cardiac coronary calcium  scoring in 02/2024.  EKG today showed NSR, frequent PVC and PACs.  Ultrasound abdominal aorta was performed in 2022 and May 2024 that showed proximal AAA 3.5 cm.  2-year ultrasound follow-up was recommended.  CT cardiac scoring showed CAC score of 927, 77th percentile for age and sex matched control.  He was started on aspirin  and high intensity statin by Dr. Alphonsa.  Patient is here today to establish care.  He denies having any angina or DOE.  He is very active at baseline.  No dizziness except for occasional dizziness positionally.  No syncope.  Fatigue ongoing for many years and noticed more fatigue lately when he was lifting weights.  He also reported palpitations.    Past Medical History:  Diagnosis Date   Allergy    Aortic aneurysm 10/11/2020   On ultrasound 3.6 cm May 2020 2 repeat again in approximately 20 months.  Patient advised to quit smoking.   ED (erectile dysfunction)     Past Surgical History:  Procedure Laterality Date   BIOPSY  12/19/2022   Procedure: BIOPSY;  Surgeon: Eartha Angelia Sieving, MD;  [facility]: [facility];  Service: Gastroenterology;;   COLONOSCOPY N/A 12/26/2017   Procedure: COLONOSCOPY;  Surgeon: Golda Claudis PENNER, MD;  [facility]: [facility];  Service: Endoscopy;  Laterality: N/A;  930   COLONOSCOPY WITH PROPOFOL  N/A 12/19/2022   Procedure: COLONOSCOPY WITH PROPOFOL ;  Surgeon: Eartha Angelia Sieving, MD;  Location: [facility];  Service: Gastroenterology;  Laterality: N/A;  9:00AM;ASA 1   POLYPECTOMY  12/19/2022   Procedure: POLYPECTOMY;  Surgeon: Eartha Angelia, Sieving, MD;  Location: [facility];  Service: Gastroenterology;;   VASECTOMY      Current Outpatient Medications  Medication Sig Dispense Refill   amLODipine  (NORVASC ) 5 MG tablet Take 1 tablet (5 mg total) by mouth daily. 90 tablet 2   aspirin  EC 81 MG tablet Take 1 tablet (81 mg total) by mouth daily. Swallow whole.     diphenoxylate -atropine  (LOMOTIL ) 2.5-0.025 MG tablet TAKE 1 TO 2 TABLETS BY MOUTH FOUR TIMES DAILY AS NEEDED FOR DIARRHEA OR LOOSE STOOLS (Patient taking differently: 1-2 daily) 90 tablet 1   Misc Natural Products (PROSTATE HEALTH PO) Take 2 tablets by mouth daily.     Multiple Vitamins-Minerals (CENTRUM SILVER PO) Take 1 tablet by mouth daily.     OVER THE COUNTER MEDICATION Take 1 tablet by mouth daily. neugenix     rosuvastatin  (CRESTOR ) 20 MG tablet Take 1 tablet (20 mg total) by mouth daily. 90 tablet 3   No current facility-administered medications for this visit.   Allergies:  Patient has no active allergies.   Social History: The patient  reports that he  has been smoking pipe. He has never used smokeless tobacco. He reports current alcohol use of about 1.0 standard drink of alcohol per week. He reports that he does not use drugs.   Family History: The patient's family history includes Cancer in his father, mother, and another family member; Diabetes in his father; Heart attack in an other family member; Hyperlipidemia in his father; Hypertension in his father.   ROS:  Please see the history of present illness. Otherwise, complete review of systems is positive for none  All other systems are reviewed and negative.   Physical  Exam: VS:  BP 130/64   Pulse (!) 36   Ht 5' 9 (1.753 m)   Wt 192 lb 6.4 oz (87.3 kg)   SpO2 98%   BMI 28.41 kg/m , BMI Body mass index is 28.41 kg/m.  Wt Readings from Last 3 Encounters:  06/10/24 192 lb 6.4 oz (87.3 kg)  06/05/24 193 lb (87.5 kg)  05/22/24 193 lb 12.6 oz (87.9 kg)    General: Patient appears comfortable at rest. HEENT: Conjunctiva and lids normal, oropharynx clear with moist mucosa. Neck: Supple, no elevated JVP or carotid bruits, no thyromegaly. Lungs: Clear to auscultation, nonlabored breathing at rest. Cardiac: Regular rate and rhythm, no S3 or significant systolic murmur, no pericardial rub. Abdomen: Soft, nontender, no hepatomegaly, bowel sounds present, no guarding or rebound. Extremities: No pitting edema, distal pulses 2+. Skin: Warm and dry. Musculoskeletal: No kyphosis. Neuropsychiatric: Alert and oriented x3, affect grossly appropriate.  Recent Labwork: 05/15/2024: ALT 21; AST 29; BUN 15; Creatinine, Ser 0.95; Hemoglobin 14.9; Platelets 218; Potassium 4.2; Sodium 140     Component Value Date/Time   CHOL 182 10/04/2023 0901   TRIG 82 10/04/2023 0901   HDL 45 10/04/2023 0901   CHOLHDL 4.0 10/04/2023 0901   LDLCALC 122 (H) 10/04/2023 0901    Assessment and Plan:  Frequent PVCs - EKG today showed NSR, frequent PVCs and frequent PACs. - Symptomatic with palpitations and worsening fatigue lately. - Obtain 2-week event monitor, live. - Obtain CT cardiac for ischemia evaluation. - Obtain echocardiogram.  Elevated coronary calcium  score - CAC score is 927, 77 percentile for age and sex matched control. - Does not have any angina or DOE.  Works out in [employer] for 3 to 4 days/week. - Continue aspirin  81 mg once daily and rosuvastatin  20 mg nightly. - ER precautions for chest pain provided.  Discussed symptoms of CAD and MI.  Imaging evidence of pulmonary arterial hypertension - Does not have any symptoms of DOE.  Obtain echocardiogram.  4.2 cm  ascending aortic aneurysm - By CT imaging in January 26.  Obtain echo current to rule out any AI.  Otherwise, we will obtain CT angio chest/aorta in 1 year.  Avoid fluoroquinolones, added under allergies.  Aggressive HTN control.  Goal BP 110 to 120 mmHg SBP.  Increase amlodipine  from 5 mg to 7.5 mg once daily.  AAA - 3.5 cm AAA in 2024.  Obtain ultrasound AAA duplex in 3 months. He is overwhelmed with the tests that are ordered in this office visit.  HTN, partially controlled - Increase amlodipine  from 5 mg to 7.5 mg once daily sometimes blood pressures are little elevated.  HLD, not at goal - Continue rosuvastatin  20 mg at bedtime.  Will need to repeat lipid panel in 3 months. Goal LDL<70.   10 minutes spent in reviewing prior medical records.  30-minute spent with the patient discussing the above problems.  15  minutes spent in documentation.  Medication Adjustments/Labs and Tests Ordered: Current medicines are reviewed at length with the patient today.  Concerns regarding medicines are outlined above.    Disposition:  Follow up 3 months  Signed November Sypher Priya Yaiza Palazzola, MD, 06/10/2024 2:08 PM    [facility] [address] [place], [place], [place] [address]  "

## 2024-06-12 ENCOUNTER — Telehealth: Payer: Self-pay | Admitting: Internal Medicine

## 2024-06-12 NOTE — Telephone Encounter (Signed)
Calling with abnormal results. Please advise  

## 2024-06-12 NOTE — Telephone Encounter (Signed)
 Spoke with Sam from [facility] stated that patient had Afib HR 76--110bpm at 2:19 p.m. Ruted to provider for review

## 2024-06-16 ENCOUNTER — Encounter: Payer: Self-pay | Admitting: Internal Medicine

## 2024-06-16 ENCOUNTER — Telehealth: Payer: Self-pay | Admitting: Internal Medicine

## 2024-06-16 ENCOUNTER — Ambulatory Visit

## 2024-06-16 NOTE — Telephone Encounter (Signed)
 Patient states that his heart monitor has fell off and unable to get it back on. Please advise

## 2024-06-17 LAB — BASIC METABOLIC PANEL WITH GFR
BUN/Creatinine Ratio: 16 (ref 10–24)
BUN: 19 mg/dL (ref 8–27)
CO2: 22 mmol/L (ref 20–29)
Calcium: 10.1 mg/dL (ref 8.6–10.2)
Chloride: 103 mmol/L (ref 96–106)
Creatinine, Ser: 1.17 mg/dL (ref 0.76–1.27)
Glucose: 89 mg/dL (ref 70–99)
Potassium: 4.7 mmol/L (ref 3.5–5.2)
Sodium: 142 mmol/L (ref 134–144)
eGFR: 65 mL/min/{1.73_m2}

## 2024-06-17 NOTE — Telephone Encounter (Signed)
 Per Dr. Mallipeddi:  If it is taking too long, schedule nurse visit for EKG. But he still needs monitor. For now, go ahead and start Eliquis 5 mg BID for stroke prevention due to new onset Afib. Will discuss in detail about Afib management when I see him in May 2026 but I will need 2 week monitor resulted by then.   Spoke with patient regarding his monitor to see if he has reached out for replacement. Patient stated that he had called them and stated he did not want another one sent out it as too much trouble and knew it wouldn't stay on. I advised him of coming in for EKG and he agreed, Scheduled for Monday 02/09 @ 8:45. He reported he was feeling fine and he just shoveled his driveway has been working out as well. Also advised him of the Eliquis he was recommended to take and he also refused at this time. I explained the importance, stated again he was fine. He will come in for EKG and go from there. I advised him will send to provider for review

## 2024-06-22 ENCOUNTER — Ambulatory Visit

## 2024-06-24 ENCOUNTER — Ambulatory Visit (HOSPITAL_COMMUNITY)

## 2024-06-25 ENCOUNTER — Ambulatory Visit

## 2024-07-23 ENCOUNTER — Ambulatory Visit (INDEPENDENT_AMBULATORY_CARE_PROVIDER_SITE_OTHER): Admitting: Gastroenterology

## 2024-08-04 ENCOUNTER — Encounter

## 2024-08-04 ENCOUNTER — Ambulatory Visit: Admitting: Pulmonary Disease

## 2024-09-17 ENCOUNTER — Ambulatory Visit: Admitting: Internal Medicine

## 2024-11-20 ENCOUNTER — Inpatient Hospital Stay

## 2024-11-24 ENCOUNTER — Ambulatory Visit: Admitting: Family Medicine

## 2024-11-27 ENCOUNTER — Inpatient Hospital Stay: Admitting: Oncology
